# Patient Record
Sex: Male | Born: 1965 | Race: White | Hispanic: No | Smoking: Current every day smoker
Health system: Southern US, Community
[De-identification: ages and names within clinical notes are randomized; demographics above are authoritative.]

## PROBLEM LIST (undated history)

## (undated) DIAGNOSIS — F411 Generalized anxiety disorder: Secondary | ICD-10-CM

## (undated) DIAGNOSIS — F3181 Bipolar II disorder: Secondary | ICD-10-CM

## (undated) DIAGNOSIS — R351 Nocturia: Secondary | ICD-10-CM

## (undated) DIAGNOSIS — N400 Enlarged prostate without lower urinary tract symptoms: Secondary | ICD-10-CM

## (undated) DIAGNOSIS — R339 Retention of urine, unspecified: Secondary | ICD-10-CM

## (undated) DIAGNOSIS — F4010 Social phobia, unspecified: Secondary | ICD-10-CM

---

## 2007-05-16 HISTORY — PX: INGUINAL HERNIA REPAIR: SUR1180

## 2014-08-19 ENCOUNTER — Ambulatory Visit (INDEPENDENT_AMBULATORY_CARE_PROVIDER_SITE_OTHER): Payer: Managed Care, Other (non HMO) | Admitting: Psychology

## 2014-08-19 DIAGNOSIS — F411 Generalized anxiety disorder: Secondary | ICD-10-CM

## 2014-08-19 DIAGNOSIS — F322 Major depressive disorder, single episode, severe without psychotic features: Secondary | ICD-10-CM

## 2016-09-18 ENCOUNTER — Encounter (HOSPITAL_COMMUNITY): Payer: Self-pay | Admitting: Emergency Medicine

## 2016-09-18 ENCOUNTER — Emergency Department (HOSPITAL_COMMUNITY): Payer: BLUE CROSS/BLUE SHIELD

## 2016-09-18 ENCOUNTER — Emergency Department (HOSPITAL_COMMUNITY)
Admission: EM | Admit: 2016-09-18 | Discharge: 2016-09-18 | Disposition: A | Discharge status: Home / Self Care | Payer: BLUE CROSS/BLUE SHIELD | Attending: Emergency Medicine | Admitting: Emergency Medicine

## 2016-09-18 DIAGNOSIS — N5082 Scrotal pain: Secondary | ICD-10-CM

## 2016-09-18 DIAGNOSIS — N453 Epididymo-orchitis: Secondary | ICD-10-CM | POA: Diagnosis not present

## 2016-09-18 DIAGNOSIS — N50811 Right testicular pain: Secondary | ICD-10-CM | POA: Diagnosis present

## 2016-09-18 LAB — URINALYSIS, ROUTINE W REFLEX MICROSCOPIC
Bilirubin Urine: NEGATIVE
GLUCOSE, UA: NEGATIVE mg/dL
Hgb urine dipstick: NEGATIVE
KETONES UR: NEGATIVE mg/dL
Nitrite: POSITIVE — AB
PH: 6 (ref 5.0–8.0)
Protein, ur: NEGATIVE mg/dL
Specific Gravity, Urine: 1.01 (ref 1.005–1.030)

## 2016-09-18 MED ORDER — CIPROFLOXACIN HCL 500 MG PO TABS: 500.0000 mg | ORAL_TABLET | Freq: Two times a day (BID) | ORAL | 0 refills | Status: DC

## 2016-09-18 MED ORDER — FENTANYL CITRATE (PF) 100 MCG/2ML IJ SOLN
50.0000 ug | Freq: Once | INTRAMUSCULAR | Status: AC
  Administered 2016-09-18: 50 ug via INTRAVENOUS
  Filled 2016-09-18: qty 2

## 2016-09-18 MED ORDER — OXYCODONE-ACETAMINOPHEN 5-325 MG PO TABS: 2.0000 | ORAL_TABLET | Freq: Four times a day (QID) | ORAL | 0 refills | Status: DC | PRN

## 2016-09-18 MED ORDER — CIPROFLOXACIN IN D5W 400 MG/200ML IV SOLN
400.0000 mg | Freq: Once | INTRAVENOUS | Status: AC
  Administered 2016-09-18: 400 mg via INTRAVENOUS
  Filled 2016-09-18: qty 200

## 2016-09-18 MED ORDER — SODIUM CHLORIDE 0.9 % IV BOLUS (SEPSIS)
1000.0000 mL | Freq: Once | INTRAVENOUS | Status: AC
  Administered 2016-09-18: 1000 mL via INTRAVENOUS

## 2016-09-18 MED ORDER — KETOROLAC TROMETHAMINE 30 MG/ML IJ SOLN
15.0000 mg | Freq: Once | INTRAMUSCULAR | Status: AC
  Administered 2016-09-18: 15 mg via INTRAVENOUS
  Filled 2016-09-18: qty 1

## 2016-09-18 MED ORDER — HYDROMORPHONE HCL 1 MG/ML IJ SOLN
1.0000 mg | Freq: Once | INTRAMUSCULAR | Status: AC
  Administered 2016-09-18: 1 mg via INTRAVENOUS
  Filled 2016-09-18: qty 1

## 2016-09-18 MED ORDER — ONDANSETRON HCL 4 MG/2ML IJ SOLN
4.0000 mg | Freq: Once | INTRAMUSCULAR | Status: AC
  Administered 2016-09-18: 4 mg via INTRAVENOUS
  Filled 2016-09-18: qty 2

## 2016-09-18 MED ORDER — IBUPROFEN 400 MG PO TABS: 400.0000 mg | ORAL_TABLET | Freq: Three times a day (TID) | ORAL | 0 refills | Status: AC

## 2016-09-18 NOTE — Discharge Instructions (Signed)
Please be sure to follow-up with your urologist as scheduled in 2 days.  If you develop new, or concerning changes, return here

## 2016-09-18 NOTE — ED Notes (Signed)
Patient made aware of need for urine specimen, patient states she will attempt to provide specimen once pain is controlled.  Patient given urinal.

## 2016-09-18 NOTE — ED Notes (Signed)
Patient declined wheelchair

## 2016-09-18 NOTE — ED Notes (Signed)
Patient transported to Ultrasound 

## 2016-09-18 NOTE — ED Triage Notes (Signed)
Patient from home with severe right testicle pain.  Patient states he has been is following with urology for a UTI, has appointment on Wednesday but states pain was too intense this morning to wait.  Patient trembling in the bed, alert and oriented.

## 2016-09-18 NOTE — ED Provider Notes (Signed)
[facility]-EMERGENCY DEPT Provider Note   CSN: [id number] Arrival date & time: 09/18/16  [id number]     History   Chief Complaint Chief Complaint  Patient presents with  . Testicle Pain    HPI Terry LeatherwoodShannon Chaudhuri is a 51 y.o. male.  HPI  Patient presents with acute worsening of testicle pain. Pain began yesterday and since onset has become severe, incapacitating, sharp, with radiation towards the right flank. There is associated swelling in the right hemiscrotum as well as nausea, but no vomiting. No relief with home OTC medication. Patient has history of similar events, is scheduled for urology follow-up in 2 days. He has had multiple prior studies, has had alternating provisional diagnoses, according to him and family members. Patient is unclear of what these diagnoses were. Patient is not currently taking antibiotics here Since onset no other clear alleviating or exacerbating factors, and no dysuria, no hematuria.   History reviewed. No pertinent past medical history.  There are no active problems to display for this patient.   Past Surgical History:  Procedure Laterality Date  . HERNIA REPAIR         Home Medications    Prior to Admission medications   Medication Sig Start Date End Date Taking? Authorizing Provider  ALPRAZolam Prudy Feeler(XANAX) 1 MG tablet Take 1 mg by mouth 2 (two) times daily. 04/24/16  Yes [provider]  b complex vitamins tablet Take 1 tablet by mouth daily.   Yes [provider]  divalproex (DEPAKOTE ER) 500 MG 24 hr tablet Take 1,000 mg by mouth at bedtime.  04/24/16 04/24/17 Yes [provider]  silodosin (RAPAFLO) 8 MG CAPS capsule Take 8 mg by mouth daily with breakfast.   Yes [provider]    Family History No family history on file.  Social History Social History  Substance Use Topics  . Smoking status: Not on file  . Smokeless tobacco: Not on file  . Alcohol use Not on file     Allergies   Patient has no  known allergies.   Review of Systems Review of Systems  Constitutional:       Per HPI, otherwise negative  HENT:       Per HPI, otherwise negative  Respiratory:       Per HPI, otherwise negative  Cardiovascular:       Per HPI, otherwise negative  Gastrointestinal: Positive for nausea. Negative for vomiting.  Endocrine:       Negative aside from HPI  Genitourinary:       Neg aside from HPI   Musculoskeletal:       Per HPI, otherwise negative  Skin: Negative.   Allergic/Immunologic: Negative for immunocompromised state.  Neurological: Negative for syncope.     Physical Exam Updated Vital Signs BP 108/60   Pulse (!) 117   Temp 99.7 F (37.6 C) (Oral)   Resp (!) 23   SpO2 95%   Physical Exam  Constitutional: He is oriented to person, place, and time. He appears well-developed.  Uncomfortable appearing thin M  HENT:  Head: Normocephalic and atraumatic.  Eyes: Conjunctivae and EOM are normal.  Cardiovascular: Normal rate and regular rhythm.   Pulmonary/Chest: Effort normal. No stridor. No respiratory distress.  Abdominal: He exhibits no distension. There is tenderness in the suprapubic area.  Genitourinary:     Musculoskeletal: He exhibits no edema.  Neurological: He is alert and oriented to person, place, and time.  Skin: Skin is warm. He is diaphoretic.  Psychiatric: He has  a normal mood and affect.  Nursing note and vitals reviewed.    ED Treatments / Results  Labs (all labs ordered are listed, but only abnormal results are displayed) Labs Reviewed  URINE CULTURE  URINALYSIS, ROUTINE W REFLEX MICROSCOPIC   Radiology US Scrotum  Result Date: 09/18/2016 CLINICAL DATA:  Acute right testicular swelling. EXAM: SCROTAL ULTRASOUND DOPPLER ULTRASOUND OF THE TESTICLES TECHNIQUE: Complete ultrasound examination of the testicles, epididymis, and other scrotal structures was performed. Color and spectral Doppler ultrasound were also utilized to evaluate blood flow to  the testicles. COMPARISON:  None. FINDINGS: Right testicle Measurements: 3.9 x 3.2 x 3.1 cm. Increased flow is noted on Doppler. No mass or microlithiasis visualized. Left testicle Measurements: 3.6 x 3.5 x 2.3 cm. No mass or microlithiasis visualized. Right epididymis:  Appears enlarged with increased flow on Doppler. Left epididymis:  Normal in size and appearance. Hydrocele:  Mild left hydrocele is noted. Varicocele: Bilateral varicoceles are noted, with left greater than right. Pulsed Doppler interrogation of both testes demonstrates normal low resistance arterial and venous waveforms bilaterally. IMPRESSION: Increased flow is noted on Doppler in the right testicle and epididymis consistent with right-sided epididymo-orchitis. There is no evidence of testicular torsion. Small left hydrocele is noted. Bilateral varicoceles are noted. Electronically Signed   By: Lupita Raider, M.D.   On: 09/18/2016 08:37   [place] Art/ven Flow Abd Pelv Doppler  Result Date: 09/18/2016 CLINICAL DATA:  Acute right testicular swelling. EXAM: SCROTAL ULTRASOUND DOPPLER ULTRASOUND OF THE TESTICLES TECHNIQUE: Complete ultrasound examination of the testicles, epididymis, and other scrotal structures was performed. Color and spectral Doppler ultrasound were also utilized to evaluate blood flow to the testicles. COMPARISON:  None. FINDINGS: Right testicle Measurements: 3.9 x 3.2 x 3.1 cm. Increased flow is noted on Doppler. No mass or microlithiasis visualized. Left testicle Measurements: 3.6 x 3.5 x 2.3 cm. No mass or microlithiasis visualized. Right epididymis:  Appears enlarged with increased flow on Doppler. Left epididymis:  Normal in size and appearance. Hydrocele:  Mild left hydrocele is noted. Varicocele: Bilateral varicoceles are noted, with left greater than right. Pulsed Doppler interrogation of both testes demonstrates normal low resistance arterial and venous waveforms bilaterally. IMPRESSION: Increased flow is noted on  Doppler in the right testicle and epididymis consistent with right-sided epididymo-orchitis. There is no evidence of testicular torsion. Small left hydrocele is noted. Bilateral varicoceles are noted. Electronically Signed   By: Lupita Raider, M.D.   On: 09/18/2016 08:37    Procedures Procedures (including critical care time)  Medications Ordered in ED Medications  ciprofloxacin (CIPRO) IVPB 400 mg (400 mg Intravenous New Bag/Given 09/18/16 0919)  sodium chloride 0.9 % bolus 1,000 mL (1,000 mLs Intravenous New Bag/Given 09/18/16 0752)  fentaNYL (SUBLIMAZE) injection 50 mcg (50 mcg Intravenous Given 09/18/16 0753)  ondansetron (ZOFRAN) injection 4 mg (4 mg Intravenous Given 09/18/16 0752)  HYDROmorphone (DILAUDID) injection 1 mg (1 mg Intravenous Given 09/18/16 0844)  ketorolac (TORADOL) 30 MG/ML injection 15 mg (15 mg Intravenous Given 09/18/16 0844)     Initial Impression / Assessment and Plan / ED Course  I have reviewed the triage vital signs and the nursing notes.  Pertinent labs & imaging results that were available during my care of the patient were reviewed by me and considered in my medical decision making (see chart for details).  9:56 AM Patient had substantial improvement after fentanyl, Toradol, Dilaudid.  Update: Patient aware of all findings, including evidence for epididymal orchiectomy this, no evidence for  torsion, no evidence for mass. Given the patient's improvement, absence of ongoing fever, and urology follow-up within 48 hours, after ultrasound didn't demonstrate evidence for epididymal orchitis, and he received initial antibiotics, he was discharged in stable condition with close outpatient follow-up.   Final Clinical Impressions(s) / ED Diagnoses   Final diagnoses:  Scrotal pain  Epididymo-orchitis    Gerhard Munch, MD 09/18/16 [phone]

## 2016-09-20 LAB — URINE CULTURE: Culture: >=100000 — AB

## 2016-09-21 ENCOUNTER — Telehealth: Payer: Self-pay | Admitting: *Deleted

## 2016-09-21 NOTE — Telephone Encounter (Signed)
Post ED Visit - Positive Culture Follow-up  Culture report reviewed by antimicrobial stewardship pharmacist:  []  Enzo BiNathan Batchelder, Pharm.D. []  Celedonio MiyamotoJeremy Frens, [address] BCPS AQ-ID []  Garvin FilaMike Maccia, Pharm.D., BCPS []  Georgina PillionElizabeth Martin, [address]Ph[address] BCPS []  MarshfieldMinh Pham, [address]Ph[address]D., BCPS, AAHIVP [x]  Estella HuskMichelle Turner, Pharm.D., BCPS, AAHIVP []  Lysle Pearlachel Rumbarger, PharmD, BCPS []  Casilda Carlsaylor Stone, PharmD, BCPS []  Pollyann SamplesAndy Johnston, PharmD, BCPS  Positive urine culture Treated with Ciprofloxacin HCL, organism sensitive to the same and no further patient follow-up is required at this time.  Virl AxeRobertson, Nillie Bartolotta [facility] 09/21/2016, 10:13 AM

## 2016-12-01 ENCOUNTER — Other Ambulatory Visit: Payer: Self-pay | Admitting: Urology

## 2016-12-27 ENCOUNTER — Encounter (HOSPITAL_BASED_OUTPATIENT_CLINIC_OR_DEPARTMENT_OTHER): Payer: Self-pay | Admitting: *Deleted

## 2016-12-29 ENCOUNTER — Encounter (HOSPITAL_BASED_OUTPATIENT_CLINIC_OR_DEPARTMENT_OTHER): Payer: Self-pay | Admitting: *Deleted

## 2017-01-03 ENCOUNTER — Encounter (HOSPITAL_BASED_OUTPATIENT_CLINIC_OR_DEPARTMENT_OTHER): Payer: Self-pay | Admitting: *Deleted

## 2017-01-03 NOTE — Progress Notes (Signed)
NPO AFTER MN W/ EXCEPTION CLEAR LIQUIDS UNTIL 0700 (NO CREAM /MILK PRODUCTS).  ARRIVE AT 1130.  NEEDS HG. WILL TAKE AM MEDS  W/ SIPS OF WATER DOS.

## 2017-01-08 ENCOUNTER — Encounter (HOSPITAL_BASED_OUTPATIENT_CLINIC_OR_DEPARTMENT_OTHER)
Admission: RE | Disposition: A | Discharge status: Home / Self Care | Payer: Self-pay | Source: Ambulatory Visit | Attending: Urology

## 2017-01-08 ENCOUNTER — Ambulatory Visit (HOSPITAL_BASED_OUTPATIENT_CLINIC_OR_DEPARTMENT_OTHER)
Admission: RE | Admit: 2017-01-08 | Discharge: 2017-01-08 | Disposition: A | Discharge status: Home / Self Care | Payer: BLUE CROSS/BLUE SHIELD | Source: Ambulatory Visit | Attending: Urology | Admitting: Urology

## 2017-01-08 ENCOUNTER — Ambulatory Visit (HOSPITAL_BASED_OUTPATIENT_CLINIC_OR_DEPARTMENT_OTHER): Payer: BLUE CROSS/BLUE SHIELD | Admitting: Certified Registered"

## 2017-01-08 ENCOUNTER — Encounter (HOSPITAL_BASED_OUTPATIENT_CLINIC_OR_DEPARTMENT_OTHER): Payer: Self-pay

## 2017-01-08 DIAGNOSIS — N401 Enlarged prostate with lower urinary tract symptoms: Secondary | ICD-10-CM | POA: Diagnosis present

## 2017-01-08 DIAGNOSIS — F3181 Bipolar II disorder: Secondary | ICD-10-CM | POA: Insufficient documentation

## 2017-01-08 DIAGNOSIS — R351 Nocturia: Secondary | ICD-10-CM | POA: Insufficient documentation

## 2017-01-08 DIAGNOSIS — R3915 Urgency of urination: Secondary | ICD-10-CM | POA: Diagnosis not present

## 2017-01-08 DIAGNOSIS — Z79899 Other long term (current) drug therapy: Secondary | ICD-10-CM | POA: Insufficient documentation

## 2017-01-08 DIAGNOSIS — F17210 Nicotine dependence, cigarettes, uncomplicated: Secondary | ICD-10-CM | POA: Diagnosis not present

## 2017-01-08 DIAGNOSIS — F411 Generalized anxiety disorder: Secondary | ICD-10-CM | POA: Insufficient documentation

## 2017-01-08 HISTORY — PX: CYSTOSCOPY WITH INSERTION OF UROLIFT: SHX6678

## 2017-01-08 HISTORY — DX: Bipolar II disorder: F31.81

## 2017-01-08 HISTORY — DX: Benign prostatic hyperplasia without lower urinary tract symptoms: N40.0

## 2017-01-08 HISTORY — DX: Retention of urine, unspecified: R33.9

## 2017-01-08 HISTORY — DX: Social phobia, unspecified: F40.10

## 2017-01-08 HISTORY — DX: Nocturia: R35.1

## 2017-01-08 HISTORY — DX: Generalized anxiety disorder: F41.1

## 2017-01-08 LAB — POCT HEMOGLOBIN-HEMACUE: Hemoglobin: 15.5 g/dL (ref 13.0–17.0)

## 2017-01-08 SURGERY — CYSTOSCOPY WITH INSERTION OF UROLIFT
Anesthesia: General

## 2017-01-08 MED ORDER — EPHEDRINE SULFATE-NACL 50-0.9 MG/10ML-% IV SOSY
PREFILLED_SYRINGE | INTRAVENOUS | Status: DC | PRN
  Administered 2017-01-08 (×3): 10 mg via INTRAVENOUS

## 2017-01-08 MED ORDER — EPHEDRINE 5 MG/ML INJ
INTRAVENOUS | Status: AC
  Filled 2017-01-08: qty 10

## 2017-01-08 MED ORDER — ONDANSETRON HCL 4 MG/2ML IJ SOLN
INTRAMUSCULAR | Status: AC
  Filled 2017-01-08: qty 2

## 2017-01-08 MED ORDER — DEXAMETHASONE SODIUM PHOSPHATE 4 MG/ML IJ SOLN
INTRAMUSCULAR | Status: DC | PRN
  Administered 2017-01-08: 10 mg via INTRAVENOUS

## 2017-01-08 MED ORDER — MIDAZOLAM HCL 2 MG/2ML IJ SOLN
INTRAMUSCULAR | Status: AC
  Filled 2017-01-08: qty 2

## 2017-01-08 MED ORDER — FENTANYL CITRATE (PF) 100 MCG/2ML IJ SOLN
INTRAMUSCULAR | Status: DC | PRN
  Administered 2017-01-08: 50 ug via INTRAVENOUS

## 2017-01-08 MED ORDER — LACTATED RINGERS IV SOLN
INTRAVENOUS | Status: DC
  Administered 2017-01-08: 12:00:00 via INTRAVENOUS
  Filled 2017-01-08: qty 1000

## 2017-01-08 MED ORDER — PROPOFOL 10 MG/ML IV BOLUS
INTRAVENOUS | Status: AC
  Filled 2017-01-08: qty 20

## 2017-01-08 MED ORDER — LIDOCAINE 2% (20 MG/ML) 5 ML SYRINGE
INTRAMUSCULAR | Status: AC
  Filled 2017-01-08: qty 5

## 2017-01-08 MED ORDER — DEXAMETHASONE SODIUM PHOSPHATE 10 MG/ML IJ SOLN
INTRAMUSCULAR | Status: AC
  Filled 2017-01-08: qty 1

## 2017-01-08 MED ORDER — CEFAZOLIN SODIUM-DEXTROSE 2-4 GM/100ML-% IV SOLN
2.0000 g | INTRAVENOUS | Status: AC
  Administered 2017-01-08: 2 g via INTRAVENOUS
  Filled 2017-01-08: qty 100

## 2017-01-08 MED ORDER — PROPOFOL 10 MG/ML IV BOLUS
INTRAVENOUS | Status: DC | PRN
  Administered 2017-01-08: 200 mg via INTRAVENOUS

## 2017-01-08 MED ORDER — PROMETHAZINE HCL 25 MG/ML IJ SOLN
6.2500 mg | INTRAMUSCULAR | Status: DC | PRN
  Filled 2017-01-08: qty 1

## 2017-01-08 MED ORDER — FENTANYL CITRATE (PF) 100 MCG/2ML IJ SOLN
25.0000 ug | INTRAMUSCULAR | Status: DC | PRN
  Filled 2017-01-08: qty 1

## 2017-01-08 MED ORDER — TRAMADOL HCL 50 MG PO TABS: 50.0000 mg | ORAL_TABLET | Freq: Four times a day (QID) | ORAL | 0 refills | Status: AC | PRN

## 2017-01-08 MED ORDER — ONDANSETRON HCL 4 MG/2ML IJ SOLN
INTRAMUSCULAR | Status: DC | PRN
  Administered 2017-01-08: 4 mg via INTRAVENOUS

## 2017-01-08 MED ORDER — MIDAZOLAM HCL 5 MG/5ML IJ SOLN
INTRAMUSCULAR | Status: DC | PRN
  Administered 2017-01-08: 2 mg via INTRAVENOUS

## 2017-01-08 MED ORDER — CEFAZOLIN SODIUM-DEXTROSE 2-4 GM/100ML-% IV SOLN
INTRAVENOUS | Status: AC
  Filled 2017-01-08: qty 100

## 2017-01-08 MED ORDER — FENTANYL CITRATE (PF) 100 MCG/2ML IJ SOLN
INTRAMUSCULAR | Status: AC
  Filled 2017-01-08: qty 2

## 2017-01-08 MED ORDER — LIDOCAINE 2% (20 MG/ML) 5 ML SYRINGE
INTRAMUSCULAR | Status: DC | PRN
  Administered 2017-01-08: 60 mg via INTRAVENOUS

## 2017-01-08 SURGICAL SUPPLY — 15 items
BAG DRAIN URO-CYSTO SKYTR STRL (DRAIN) ×3 IMPLANT
BAG URINE DRAINAGE (UROLOGICAL SUPPLIES) ×3 IMPLANT
CATH FOLEY 2WAY SLVR  5CC 16FR (CATHETERS) ×4
CATH FOLEY 2WAY SLVR 5CC 16FR (CATHETERS) ×2 IMPLANT
CLOTH BEACON ORANGE TIMEOUT ST (SAFETY) ×3 IMPLANT
GLOVE BIO SURGEON STRL SZ8 (GLOVE) ×3 IMPLANT
GOWN STRL REUS W/TWL LRG LVL3 (GOWN DISPOSABLE) ×3 IMPLANT
GOWN STRL REUS W/TWL XL LVL3 (GOWN DISPOSABLE) ×3 IMPLANT
HOLDER FOLEY CATH W/STRAP (MISCELLANEOUS) IMPLANT
IV NS IRRIG 3000ML ARTHROMATIC (IV SOLUTION) ×6 IMPLANT
MANIFOLD NEPTUNE II (INSTRUMENTS) ×3 IMPLANT
PACK CYSTO (CUSTOM PROCEDURE TRAY) ×3 IMPLANT
SYSTEM UROLIFT (Male Continence) ×12 IMPLANT
TUBE CONNECTING 12'X1/4 (SUCTIONS) ×1
TUBE CONNECTING 12X1/4 (SUCTIONS) ×2 IMPLANT

## 2017-01-08 NOTE — Anesthesia Preprocedure Evaluation (Signed)
Anesthesia Evaluation  Patient identified by MRN, date of birth, ID band Patient awake    Reviewed: Allergy & Precautions, NPO status , Patient's Chart, lab work & pertinent test results  Airway Mallampati: II  TM Distance: >3 FB Neck ROM: Full    Dental  (+) Dental Advisory Given   Pulmonary Current Smoker,    breath sounds clear to auscultation       Cardiovascular negative cardio ROS   Rhythm:Regular Rate:Normal     Neuro/Psych Bipolar Disorder negative neurological ROS     GI/Hepatic negative GI ROS, Neg liver ROS,   Endo/Other  negative endocrine ROS  Renal/GU negative Renal ROS     Musculoskeletal   Abdominal   Peds  Hematology negative hematology ROS (+)   Anesthesia Other Findings   Reproductive/Obstetrics                             Anesthesia Physical Anesthesia Plan  ASA: I  Anesthesia Plan: General   Post-op Pain Management:    Induction: Intravenous  PONV Risk Score and Plan: 2 and Ondansetron, Dexamethasone and Treatment may vary due to age or medical condition  Airway Management Planned: LMA  Additional Equipment:   Intra-op Plan:   Post-operative Plan: Extubation in OR  Informed Consent: I have reviewed the patients History and Physical, chart, labs and discussed the procedure including the risks, benefits and alternatives for the proposed anesthesia with the patient or authorized representative who has indicated his/her understanding and acceptance.   Dental advisory given  Plan Discussed with: CRNA  Anesthesia Plan Comments:         Anesthesia Quick Evaluation

## 2017-01-08 NOTE — Anesthesia Postprocedure Evaluation (Signed)
Anesthesia Post Note  Patient: Jet Wagley  Procedure(s) Performed: Procedure(s) (LRB): CYSTOSCOPY WITH INSERTION OF UROLIFT (N/A)     Patient location during evaluation: PACU Anesthesia Type: General Level of consciousness: awake and alert Pain management: pain level controlled Vital Signs Assessment: post-procedure vital signs reviewed and stable Respiratory status: spontaneous breathing, nonlabored ventilation, respiratory function stable and patient connected to nasal cannula oxygen Cardiovascular status: blood pressure returned to baseline and stable Postop Assessment: no signs of nausea or vomiting Anesthetic complications: no    Last Vitals:  Vitals:   01/08/17 1400 01/08/17 1401  BP:  105/69  Pulse: 94 95  Resp: 19 18  Temp:    SpO2: 95% 95%    Last Pain:  Vitals:   01/08/17 1134  TempSrc: Oral                 Herta Hink,JAMES TERRILL

## 2017-01-08 NOTE — Discharge Instructions (Signed)
Indwelling Urinary Catheter Care, Adult °Take good care of your catheter to keep it working and to prevent problems. °How to wear your catheter °Attach your catheter to your leg with tape (adhesive tape) or a leg strap. Make sure it is not too tight. If you use tape, remove any bits of tape that are already on the catheter. °How to wear a drainage bag °You should have: °· A large overnight bag. °· A small leg bag. ° °Overnight Bag °You may wear the overnight bag at any time. Always keep the bag below the level of your bladder but off the floor. When you sleep, put a clean plastic bag in a wastebasket. Then hang the bag inside the wastebasket. °Leg Bag °Never wear the leg bag at night. Always wear the leg bag below your knee. Keep the leg bag secure with a leg strap or tape. °How to care for your skin °· Clean the skin around the catheter at least once every day. °· Shower every day. Do not take baths. °· Put creams, lotions, or ointments on your genital area only as told by your doctor. °· Do not use powders, sprays, or lotions on your genital area. °How to clean your catheter and your skin °1. Wash your hands with soap and water. °2. Wet a washcloth in warm water and gentle (mild) soap. °3. Use the washcloth to clean the skin where the catheter enters your body. Clean downward and wipe away from the catheter in small circles. Do not wipe toward the catheter. °4. Pat the area dry with a clean towel. Make sure to clean off all soap. °How to care for your drainage bags °Empty your drainage bag when it is ?-½ full or at least 2-3 times a day. Replace your drainage bag once a month or sooner if it starts to smell bad or look dirty. Do not clean your drainage bag unless told by your doctor. °Emptying a drainage bag ° °Supplies Needed °· Rubbing alcohol. °· Gauze pad or cotton ball. °· Tape or a leg strap. ° °Steps °1. Wash your hands with soap and water. °2. Separate (detach) the bag from your leg. °3. Hold the bag over  the toilet or a clean container. Keep the bag below your hips and bladder. This stops pee (urine) from going back into the tube. °4. Open the pour spout at the bottom of the bag. °5. Empty the pee into the toilet or container. Do not let the pour spout touch any surface. °6. Put rubbing alcohol on a gauze pad or cotton ball. °7. Use the gauze pad or cotton ball to clean the pour spout. °8. Close the pour spout. °9. Attach the bag to your leg with tape or a leg strap. °10. Wash your hands. ° °Changing a drainage bag °Supplies Needed °· Alcohol wipes. °· A clean drainage bag. °· Adhesive tape or a leg strap. ° °Steps °1. Wash your hands with soap and water. °2. Separate the dirty bag from your leg. °3. Pinch the rubber catheter with your fingers so that pee does not spill out. °4. Separate the catheter tube from the drainage tube where these tubes connect (at the connection valve). Do not let the tubes touch any surface. °5. Clean the end of the catheter tube with an alcohol wipe. Use a different alcohol wipe to clean the end of the drainage tube. °6. Connect the catheter tube to the drainage tube of the clean bag. °7. Attach the new bag to   the leg with adhesive tape or a leg strap. °8. Wash your hands. ° °How to prevent infection and other problems °· Never pull on your catheter or try to remove it. Pulling can damage tissue in your body. °· Always wash your hands before and after touching your catheter. °· If a leg strap gets wet, replace it with a dry one. °· Drink enough fluids to keep your pee clear or pale yellow, or as told by your doctor. °· Do not let the drainage bag or tubing touch the floor. °· Wear cotton underwear. °· If you are male, wipe from front to back after you poop (have a bowel movement). °· Check on the catheter often to make sure it works and the tubing is not twisted. °Get help if: °· Your pee is cloudy. °· Your pee smells unusually bad. °· Your pee is not draining into the bag. °· Your  tube gets clogged. °· Your catheter starts to leak. °· Your bladder feels full. °Get help right away if: °· You have redness, swelling, or pain where the catheter enters your body. °· You have fluid, pus, or a bad smell coming from the area where the catheter enters your body. °· The area where the catheter enters your body feels warm. °· You have a fever. °· You have pain in your: °? Stomach (abdomen). °? Legs. °? Lower back. °? Bladder. °· You see blood fill the catheter. °· Your pee is pink or red. °· You feel sick to your stomach (nauseous). °· You throw up (vomit). °· You have chills. °· Your catheter gets pulled out. °This information is not intended to replace advice given to you by your health care provider. Make sure you discuss any questions you have with your health care provider. °Document Released: 08/26/2012 Document Revised: 03/29/2016 Document Reviewed: 10/14/2013 °Elsevier Interactive Patient Education © 2018 [employer] ° ° ° ° °Post Anesthesia Home Care Instructions ° °Activity: °Get plenty of rest for the remainder of the day. A responsible individual must stay with you for 24 hours following the procedure.  °For the next 24 hours, DO NOT: °-Drive a car °-Operate machinery °-Drink alcoholic beverages °-Take any medication unless instructed by your physician °-Make any legal decisions or sign important papers. ° °Meals: °Start with liquid foods such as gelatin or soup. Progress to regular foods as tolerated. Avoid greasy, spicy, heavy foods. If nausea and/or vomiting occur, drink only clear liquids until the nausea and/or vomiting subsides. Call your physician if vomiting continues. ° °Special Instructions/Symptoms: °Your throat may feel dry or sore from the anesthesia or the breathing tube placed in your throat during surgery. If this causes discomfort, gargle with warm salt water. The discomfort should disappear within 24 hours. ° °If you had a scopolamine patch placed behind your ear for the  management of post- operative nausea and/or vomiting: ° °1. The medication in the patch is effective for 72 hours, after which it should be removed.  Wrap patch in a tissue and discard in the trash. Wash hands thoroughly with soap and water. °2. You may remove the patch earlier than 72 hours if you experience unpleasant side effects which may include dry mouth, dizziness or visual disturbances. °3. Avoid touching the patch. Wash your hands with soap and water after contact with the patch. °  ° °

## 2017-01-08 NOTE — Op Note (Signed)
   PREOPERATIVE DIAGNOSIS: Benign prostatic hypertrophy with urinary urgency  POSTOPERATIVE DIAGNOSIS: Benign prostatic hypertrophy with urinary urgency  PROCEDURE: Cystoscopy with implantation of UroLift devices, 4 implants.  SURGEON: Wilkie Aye, M.D.  ANESTHESIA: General  ANTIBIOTICS: ancef  SPECIMEN: None.  DRAINS: A 16-French Foley catheter.  BLOOD LOSS: Minimal.  COMPLICATIONS: None.  INDICATIONS:The Patient is an 51 year old white male with BPH and urinary urgency. He has failed medical therapy and has elected UroLift for definitive treatment.  FINDINGS OF PROCEDURE: He was taken to the operating room where a genral anesthetic was induced. He was placed in lithotomy position and was fitted with PAS hose. His perineum and genitalia were prepped with chlorhexidine, and he was draped in usual sterile fashion.  Cystoscopy was performed using the UroLift scope and 0 degree lens. Examination revealed a normal urethra. The external sphincter was intact. Prostatic urethra was approximately 4 cm in length with lateral lobe enlargement. There was also little bit of bladder neck elevation. Inspection of bladder revealed mild-to-moderate trabeculation with no tumors, stones, or inflammation. No cellules or diverticula were noted. Ureteral orifices were in their normal anatomic position effluxing clear urine.  After initial cystoscopy, the visual obturator was replaced with the first UroLift device. This was turned to the 9 o'clock position and pulled back to the veru and then slightly advanced. Pressure was then applied to the right lateral lobe and the UroLift device was deployed.  The second UroLift device was then inserted and applied to the left lateral lobe at 3 o'clock and deployed in the mid prostatic urethra. After this, there was still some apparent obstruction closer to the bladder neck. So a second level of UroLift your left device was  applied between the mid urethra and the proximal urethra providing further patency to the prostatic urethra. At this point, there was mild bleeding but the patient did have a spinal anesthetic. So it was thought that a Foley catheter was indicated. The scope was removed and a 16-French Foley catheter was inserted without difficulty. The balloon was filled with 10 mL sterile fluid, and the catheter was placed to straight drainage.  COMPLICATIONS: None   CONDITION: Stable, extubated, transferred to PACU  PLAN: The patient will be discharged home and followup in 2 days for a voiding trial.

## 2017-01-08 NOTE — H&P (Signed)
Urology Admission H&P  Chief Complaint: urinary urgency  History of Present Illness: [name] is a 51yo with a hx of BPH with urinary urgency refractory to medical therapy  Past Medical History:  Diagnosis Date  . Bipolar 2 disorder (HCC)   . BPH (benign prostatic hyperplasia)   . GAD (generalized anxiety disorder)   . Incomplete emptying of bladder   . Nocturia   . Social anxiety disorder    Past Surgical History:  Procedure Laterality Date  . INGUINAL HERNIA REPAIR Bilateral 2009    Home Medications:  Prescriptions Prior to Admission  Medication Sig Dispense Refill Last Dose  . ALPRAZolam (XANAX) 1 MG tablet Take 1 mg by mouth 2 (two) times daily.    01/08/2017 at 0700  . b complex vitamins tablet Take 1 tablet by mouth daily.   Past Week at Unknown time  . divalproex (DEPAKOTE ER) 500 MG 24 hr tablet Take 1,000 mg by mouth 2 (two) times daily.    01/08/2017 at 0700  . silodosin (RAPAFLO) 8 MG CAPS capsule Take 8 mg by mouth daily with breakfast.    01/08/2017 at 0700  . sulfamethoxazole-trimethoprim (BACTRIM DS,SEPTRA DS) 800-160 MG tablet Take 1 tablet by mouth 2 (two) times daily.   01/08/2017 at 0700   Allergies: No Known Allergies  History reviewed. No pertinent family history. Social History:  reports that he has been smoking Cigarettes.  He has a 25.00 pack-year smoking history. He has never used smokeless tobacco. He reports that he drinks alcohol. His drug history is not on file.  Review of Systems  Genitourinary: Positive for frequency and urgency.  All other systems reviewed and are negative.   Physical Exam:  Vital signs in last 24 hours: Temp:  [98.4 F (36.9 C)] 98.4 F (36.9 C) (08/27 1134) Pulse Rate:  [84] 84 (08/27 1134) Resp:  [16] 16 (08/27 1134) BP: (105)/(60) 105/60 (08/27 1134) SpO2:  [99 %] 99 % (08/27 1134) Weight:  [74.8 kg (165 lb)] 74.8 kg (165 lb) (08/27 1134) Physical Exam  Constitutional: He is oriented to person, place, and time. He  appears well-developed and well-nourished.  HENT:  Head: Normocephalic and atraumatic.  Eyes: Pupils are equal, round, and reactive to light. EOM are normal.  Neck: Normal range of motion. No thyromegaly present.  Cardiovascular: Normal rate and regular rhythm.   Respiratory: Effort normal. No respiratory distress.  GI: Soft. He exhibits no distension.  Musculoskeletal: Normal range of motion. He exhibits no edema.  Neurological: He is alert and oriented to person, place, and time.  Skin: Skin is warm and dry.  Psychiatric: He has a normal mood and affect. His behavior is normal. Judgment and thought content normal.    Laboratory Data:  Results for orders placed or performed during the hospital encounter of 01/08/17 (from the past 24 hour(s))  Hemoglobin-hemacue, POC     Status: None   Collection Time: 01/08/17 12:23 PM  Result Value Ref Range   Hemoglobin 15.5 13.0 - 17.0 g/dL   No results found for this or any previous visit (from the past 240 hour(s)). Creatinine: No results for input(s): CREATININE in the last 168 hours. Baseline Creatinine: unknwon  Impression/Assessment:  50yo with BPH with urgenyc refractory to medical therayp  Plan:  The risks/benefits/alternatives to Urolift were explained to the patient and he understands and wishes to proceed with surgery  Terry Lloyd 01/08/2017, 12:43 PM

## 2017-01-08 NOTE — Transfer of Care (Signed)
Immediate Anesthesia Transfer of Care Note  Patient: Terry Lloyd  Procedure(s) Performed: Procedure(s) (LRB): CYSTOSCOPY WITH INSERTION OF UROLIFT (N/A)  Patient Location: PACU  Anesthesia Type: General  Level of Consciousness: awake, oriented, sedated and patient cooperative  Airway & Oxygen Therapy: Patient Spontanous Breathing and Patient connected to face mask oxygen  Post-op Assessment: Report given to PACU RN and Post -op Vital signs reviewed and stable  Post vital signs: Reviewed and stable  Complications: No apparent anesthesia complications Last Vitals:  Vitals:   01/08/17 1134  BP: 105/60  Pulse: 84  Resp: 16  Temp: 36.9 C  SpO2: 99%    Last Pain:  Vitals:   01/08/17 1134  TempSrc: Oral      Patients Stated Pain Goal: 5 (01/08/17 1214)

## 2017-01-08 NOTE — Anesthesia Procedure Notes (Signed)
Procedure Name: LMA Insertion Date/Time: 01/08/2017 12:55 PM Performed by: Renella Cunas D Pre-anesthesia Checklist: Patient identified, Emergency Drugs available, Suction available and Patient being monitored Patient Re-evaluated:Patient Re-evaluated prior to induction Oxygen Delivery Method: Circle system utilized Preoxygenation: Pre-oxygenation with 100% oxygen Induction Type: IV induction Ventilation: Mask ventilation without difficulty LMA: LMA inserted LMA Size: 4.0 Number of attempts: 1 Airway Equipment and Method: Bite block Placement Confirmation: positive ETCO2 Tube secured with: Tape Dental Injury: Teeth and Oropharynx as per pre-operative assessment

## 2017-01-09 ENCOUNTER — Encounter (HOSPITAL_BASED_OUTPATIENT_CLINIC_OR_DEPARTMENT_OTHER): Payer: Self-pay | Admitting: Urology

## 2018-01-04 ENCOUNTER — Emergency Department (HOSPITAL_COMMUNITY): Payer: BLUE CROSS/BLUE SHIELD

## 2018-01-04 ENCOUNTER — Encounter (HOSPITAL_COMMUNITY): Payer: Self-pay | Admitting: *Deleted

## 2018-01-04 ENCOUNTER — Emergency Department (HOSPITAL_COMMUNITY)
Admission: EM | Admit: 2018-01-04 | Discharge: 2018-01-04 | Disposition: A | Discharge status: Home / Self Care | Payer: BLUE CROSS/BLUE SHIELD | Attending: Emergency Medicine | Admitting: Emergency Medicine

## 2018-01-04 ENCOUNTER — Other Ambulatory Visit: Payer: Self-pay

## 2018-01-04 DIAGNOSIS — F17210 Nicotine dependence, cigarettes, uncomplicated: Secondary | ICD-10-CM | POA: Diagnosis not present

## 2018-01-04 DIAGNOSIS — N390 Urinary tract infection, site not specified: Secondary | ICD-10-CM | POA: Insufficient documentation

## 2018-01-04 DIAGNOSIS — Z79899 Other long term (current) drug therapy: Secondary | ICD-10-CM | POA: Insufficient documentation

## 2018-01-04 DIAGNOSIS — R300 Dysuria: Secondary | ICD-10-CM | POA: Diagnosis present

## 2018-01-04 LAB — URINALYSIS, ROUTINE W REFLEX MICROSCOPIC
Bilirubin Urine: NEGATIVE
Glucose, UA: NEGATIVE mg/dL
Hgb urine dipstick: NEGATIVE
Ketones, ur: NEGATIVE mg/dL
Nitrite: NEGATIVE
Protein, ur: 30 mg/dL — AB
Specific Gravity, Urine: 1.008 (ref 1.005–1.030)
pH: 7 (ref 5.0–8.0)

## 2018-01-04 LAB — CBC WITH DIFFERENTIAL/PLATELET
ABS IMMATURE GRANULOCYTES: 0 10*3/uL (ref 0.0–0.1)
BASOS ABS: 0.1 10*3/uL (ref 0.0–0.1)
Basophils Relative: 1 %
Eosinophils Absolute: 0.2 10*3/uL (ref 0.0–0.7)
Eosinophils Relative: 1 %
HCT: 41.7 % (ref 39.0–52.0)
HEMOGLOBIN: 13.5 g/dL (ref 13.0–17.0)
Immature Granulocytes: 0 %
LYMPHS PCT: 29 %
Lymphs Abs: 4.1 10*3/uL — ABNORMAL HIGH (ref 0.7–4.0)
MCH: 31.1 pg (ref 26.0–34.0)
MCHC: 32.4 g/dL (ref 30.0–36.0)
MCV: 96.1 fL (ref 78.0–100.0)
Monocytes Absolute: 1.3 10*3/uL — ABNORMAL HIGH (ref 0.1–1.0)
Monocytes Relative: 9 %
NEUTROS ABS: 8.5 10*3/uL — AB (ref 1.7–7.7)
Neutrophils Relative %: 60 %
Platelets: 341 10*3/uL (ref 150–400)
RBC: 4.34 MIL/uL (ref 4.22–5.81)
RDW: 13.8 % (ref 11.5–15.5)
WBC: 14.2 10*3/uL — AB (ref 4.0–10.5)

## 2018-01-04 LAB — BASIC METABOLIC PANEL
Anion gap: 8 (ref 5–15)
BUN: 11 mg/dL (ref 6–20)
CHLORIDE: 106 mmol/L (ref 98–111)
CO2: 23 mmol/L (ref 22–32)
CREATININE: 0.76 mg/dL (ref 0.61–1.24)
Calcium: 9.2 mg/dL (ref 8.9–10.3)
Glucose, Bld: 99 mg/dL (ref 70–99)
POTASSIUM: 4.2 mmol/L (ref 3.5–5.1)
Sodium: 137 mmol/L (ref 135–145)

## 2018-01-04 MED ORDER — CEPHALEXIN 500 MG PO CAPS: 500.0000 mg | ORAL_CAPSULE | Freq: Three times a day (TID) | ORAL | 0 refills | Status: AC

## 2018-01-04 MED ORDER — IOPAMIDOL (ISOVUE-300) INJECTION 61%
100.0000 mL | Freq: Once | INTRAVENOUS | Status: AC | PRN
  Administered 2018-01-04: 100 mL via INTRAVENOUS

## 2018-01-04 MED ORDER — IOPAMIDOL (ISOVUE-300) INJECTION 61%
INTRAVENOUS | Status: AC
  Filled 2018-01-04: qty 100

## 2018-01-04 MED ORDER — SODIUM CHLORIDE 0.9 % IV SOLN
1.0000 g | Freq: Once | INTRAVENOUS | Status: AC
  Administered 2018-01-04: 1 g via INTRAVENOUS
  Filled 2018-01-04: qty 10

## 2018-01-04 NOTE — ED Provider Notes (Signed)
[facility] EMERGENCY DEPARTMENT Provider Note  CSN: [id number] Arrival date & time: 01/04/18 0013  Chief Complaint(s) Recurrent UTI and Testicle Pain  HPI Terry Lloyd is a 52 y.o. male with a history of BPH status post UroLift 1 year ago who presents to the emergency department with several days mild dysuria.  Today he noted purulent penile drainage and testicular pain similar to prior urinary tract infections.  He denies any prior history of STD.  No prior history of prostatitis.  Denies any abdominal pain.  No fevers or chills.  No nausea vomiting.  No diarrhea.  Patient endorses same-sex anal intercourse without protection.  On review of records patient has had prior urinary tract infections which grew out E. Coli sensitive to cephalosporins.   HPI  Past Medical History Past Medical History:  Diagnosis Date  . Bipolar 2 disorder (HCC)   . BPH (benign prostatic hyperplasia)   . GAD (generalized anxiety disorder)   . Incomplete emptying of bladder   . Nocturia   . Social anxiety disorder    There are no active problems to display for this patient.  Home Medication(s) Prior to Admission medications   Medication Sig Start Date End Date Taking? Authorizing Provider  ALPRAZolam Prudy Feeler) 1 MG tablet Take 1 mg by mouth 2 (two) times daily.  04/24/16  Yes [provider]  divalproex (DEPAKOTE ER) 500 MG 24 hr tablet Take 1,000 mg by mouth 2 (two) times daily.  04/24/16 01/04/26 Yes [provider]  cephALEXin (KEFLEX) 500 MG capsule Take 1 capsule (500 mg total) by mouth 3 (three) times daily for 10 days. 01/04/18 01/14/18  Nira Conn, MD  traMADol (ULTRAM) 50 MG tablet Take 1 tablet (50 mg total) by mouth every 6 (six) hours as needed. Patient not taking: Reported on 01/04/2018 01/08/17 01/08/18  Malen Gauze, MD         Past Surgical History Past Surgical History:  Procedure Laterality Date  . CYSTOSCOPY WITH INSERTION OF UROLIFT N/A 01/08/2017   Procedure: CYSTOSCOPY WITH INSERTION OF UROLIFT;  Surgeon: Malen Gauze, MD;  Location: [facility];  Service: Urology;  Laterality: N/A;  . INGUINAL HERNIA REPAIR Bilateral 2009   Family History No family history on file.  Social History Social History   Tobacco Use  . Smoking status: Current Every Day Smoker    Packs/day: 1.00    Years: 25.00    Pack years: 25.00    Types: Cigarettes  . Smokeless tobacco: Never Used  Substance Use Topics  . Alcohol use: Yes    Comment: rare  . Drug use: Not on file   Allergies Patient has no known allergies.  Review of Systems Review of Systems All other systems are reviewed and are negative for acute change except as noted in the HPI  Physical Exam Vital Signs  I have reviewed the triage vital signs BP (!) 149/92   Pulse 92   Temp 98.1 F (36.7 C)   Resp 16   SpO2 100%   Physical Exam  Constitutional: He is oriented to person, place, and time. He appears well-developed and well-nourished. No distress.  HENT:  Head: Normocephalic and atraumatic.  Right Ear: External ear normal.  Left Ear: External ear normal.  Nose: Nose normal.  Mouth/Throat: Mucous membranes are normal. No trismus in the jaw.  Eyes: Conjunctivae and EOM are normal. No scleral icterus.  Neck: Normal range of motion and phonation normal.  Cardiovascular: Normal rate and  regular rhythm.  Pulmonary/Chest: Effort normal. No stridor. No respiratory distress.  Abdominal: He exhibits no distension. There is no tenderness. There is no rigidity, no rebound and no guarding. Hernia confirmed negative in the right inguinal area and confirmed negative in the left inguinal area.  Genitourinary: Testes normal. Cremasteric reflex is present. Right testis shows no mass, no swelling and no tenderness. Left testis shows no  mass, no swelling and no tenderness. Uncircumcised. Discharge (mild purulence at the urethral meatus) found.  Musculoskeletal: Normal range of motion. He exhibits no edema.  Neurological: He is alert and oriented to person, place, and time.  Skin: He is not diaphoretic.  Psychiatric: He has a normal mood and affect. His behavior is normal.  Vitals reviewed.   ED Results and Treatments Labs (all labs ordered are listed, but only abnormal results are displayed) Labs Reviewed  URINALYSIS, ROUTINE W REFLEX MICROSCOPIC - Abnormal; Notable for the following components:      Result Value   APPearance HAZY (*)    Protein, ur 30 (*)    Leukocytes, UA LARGE (*)    Bacteria, UA MANY (*)    All other components within normal limits  CBC WITH DIFFERENTIAL/PLATELET - Abnormal; Notable for the following components:   WBC 14.2 (*)    Neutro Abs 8.5 (*)    Lymphs Abs 4.1 (*)    Monocytes Absolute 1.3 (*)    All other components within normal limits  URINE CULTURE  BASIC METABOLIC PANEL  GC/CHLAMYDIA PROBE AMP ([place]) NOT [facility]                                                                                                                         EKG  EKG Interpretation  Date/Time:    Ventricular Rate:    PR Interval:    QRS Duration:   QT Interval:    QTC Calculation:   R Axis:     Text Interpretation:        Radiology Ct Abdomen Pelvis W Contrast  Result Date: 01/04/2018 CLINICAL DATA:  UTI testicular pain EXAM: CT ABDOMEN AND PELVIS WITH CONTRAST TECHNIQUE: Multidetector CT imaging of the abdomen and pelvis was performed using the standard protocol following bolus administration of intravenous contrast. CONTRAST:  100mL ISOVUE-300 IOPAMIDOL (ISOVUE-300) INJECTION 61% COMPARISON:  Ultrasound 09/18/2016 FINDINGS: Lower chest: Lung bases demonstrate no acute consolidation or effusion. Heart size within normal limits. Small hiatal hernia Hepatobiliary: Subcentimeter hypodense liver  lesions too small to further characterize. No calcified gallstone or biliary dilatation. Pancreas: Unremarkable. No pancreatic ductal dilatation or surrounding inflammatory changes. Spleen: Normal in size without focal abnormality. Adrenals/Urinary Tract: Bilateral fat density adrenal gland nodules measuring 16 mm on the right and 19 mm on the left. Kidneys are normal, without renal calculi, focal lesion, or hydronephrosis. Bladder is markedly thick walled and irregular. Stomach/Bowel: Stomach is within normal limits. Appendix appears normal. No evidence of bowel wall thickening, distention, or inflammatory changes. Vascular/Lymphatic: Mild aortic  atherosclerosis. No significantly enlarged lymph nodes Reproductive: Postsurgical changes of the prostate. Other: Negative for free air or free fluid. Musculoskeletal: No acute or significant osseous findings. IMPRESSION: 1. Marked wall thickening of the bladder, could be secondary to cystitis or chronic obstruction 2. Bilateral adrenal gland adenomas. Electronically Signed   By: Jasmine Pang M.D.   On: 01/04/2018 03:40   Pertinent labs & imaging results that were available during my care of the patient were reviewed by me and considered in my medical decision making (see chart for details).  Medications Ordered in ED Medications  cefTRIAXone (ROCEPHIN) 1 g in sodium chloride 0.9 % 100 mL IVPB (0 g Intravenous Stopped 01/04/18 0337)  iopamidol (ISOVUE-300) 61 % injection 100 mL (100 mLs Intravenous Contrast Given 01/04/18 0311)                                                                                                                                    Procedures Procedures  (including critical care time)  Medical Decision Making / ED Course I have reviewed the nursing notes for this encounter and the patient's prior records (if available in EHR or on provided paperwork).    Work-up revealed leukocytosis.  UA consistent with urinary tract infection.   Given your left and purulence, CT of the abdomen and pelvis was obtained to rule out surgical complication versus periprosthetic abscess.  CT scan was positive only for bladder wall thickening consistent with urinary tract infection.  Urine culture sent.  GC/chlamydia also sent.  Patient given empiric Rocephin.  Will discharge home with Keflex.  Recommend follow-up in 7 to 10 days for repeat urinalysis.  The patient appears reasonably screened and/or stabilized for discharge and I doubt any other medical condition or other [facility] requiring further screening, evaluation, or treatment in the ED at this time prior to discharge.  The patient is safe for discharge with strict return precautions.   Final Clinical Impression(s) / ED Diagnoses Final diagnoses:  Lower urinary tract infectious disease    Disposition: Discharge  Condition: Good  I have discussed the results, Dx and Tx plan with the patient who expressed understanding and agree(s) with the plan. Discharge instructions discussed at great length. The patient was given strict return precautions who verbalized understanding of the instructions. No further questions at time of discharge.    ED Discharge Orders         Ordered    cephALEXin (KEFLEX) 500 MG capsule  3 times daily     01/04/18 0354           Follow Up: Arlan Organ, MD [address] [place] [address] [phone]  Schedule an appointment as soon as possible for a visit  in 2 weeks for repeat urine analysis to ensure appropriate treatement.     This chart was dictated using voice recognition software.  Despite best efforts to proofread,  errors can occur  which can change the documentation meaning.   Nira Conn, MD 01/04/18 [phone]

## 2018-01-04 NOTE — ED Notes (Signed)
Patient verbalizes understanding of medications and discharge instructions. No further questions at this time. VSS and patient ambulatory at discharge.   

## 2018-01-04 NOTE — ED Triage Notes (Signed)
Pt has hx of UTI; today started having testicular pain. Has had burning with urination and has had purulent penile drainage

## 2018-01-06 LAB — URINE CULTURE: Culture: >=100000 — AB

## 2018-01-07 ENCOUNTER — Telehealth: Payer: Self-pay | Admitting: Emergency Medicine

## 2018-01-07 NOTE — Telephone Encounter (Signed)
Post ED Visit - Positive Culture Follow-up  Culture report reviewed by antimicrobial stewardship pharmacist:  []  Terry Lloyd, Pharm.D. [x]  Terry Lloyd, Pharm.D., BCPS AQ-ID []  Terry Lloyd, Pharm.D., BCPS []  Terry Lloyd, Pharm.D., BCPS []  PalisadeMinh Lloyd, [address]Pharm.D., BCPS, AAHIVP []  Terry Lloyd, Pharm.D., BCPS, AAHIVP []  Terry Lloyd, PharmD, BCPS []  Terry Lloyd, PharmD, BCPS []  Terry Lloyd, PharmD, BCPS []  Terry Lloyd, PharmD  Positive urine culture Treated with cephalexin, organism sensitive to the same and no further patient follow-up is required at this time.  Terry Lloyd, Terry Lloyd 01/07/2018, 4:04 PM

## 2019-02-04 ENCOUNTER — Encounter (HOSPITAL_COMMUNITY): Payer: Self-pay | Admitting: Emergency Medicine

## 2019-02-04 ENCOUNTER — Emergency Department (HOSPITAL_COMMUNITY)
Admission: EM | Admit: 2019-02-04 | Discharge: 2019-02-04 | Disposition: A | Discharge status: Home / Self Care | Payer: BC Managed Care – PPO | Attending: Emergency Medicine | Admitting: Emergency Medicine

## 2019-02-04 DIAGNOSIS — N390 Urinary tract infection, site not specified: Secondary | ICD-10-CM | POA: Insufficient documentation

## 2019-02-04 DIAGNOSIS — R300 Dysuria: Secondary | ICD-10-CM | POA: Diagnosis present

## 2019-02-04 DIAGNOSIS — F17210 Nicotine dependence, cigarettes, uncomplicated: Secondary | ICD-10-CM | POA: Insufficient documentation

## 2019-02-04 DIAGNOSIS — Z79899 Other long term (current) drug therapy: Secondary | ICD-10-CM | POA: Insufficient documentation

## 2019-02-04 LAB — URINALYSIS, ROUTINE W REFLEX MICROSCOPIC
Bilirubin Urine: NEGATIVE
Glucose, UA: NEGATIVE mg/dL
Ketones, ur: NEGATIVE mg/dL
Nitrite: POSITIVE — AB
Protein, ur: 30 mg/dL — AB
Specific Gravity, Urine: 1.003 — ABNORMAL LOW (ref 1.005–1.030)
WBC, UA: >50 WBC/hpf — ABNORMAL HIGH (ref 0–5)
pH: 7 (ref 5.0–8.0)

## 2019-02-04 MED ORDER — OXYCODONE HCL 5 MG PO TABS
5.0000 mg | ORAL_TABLET | Freq: Once | ORAL | Status: AC
  Administered 2019-02-04: 5 mg via ORAL
  Filled 2019-02-04: qty 1

## 2019-02-04 MED ORDER — CEPHALEXIN 500 MG PO CAPS: 500.0000 mg | ORAL_CAPSULE | Freq: Four times a day (QID) | ORAL | 0 refills | Status: DC

## 2019-02-04 MED ORDER — KETOROLAC TROMETHAMINE 60 MG/2ML IM SOLN
15.0000 mg | Freq: Once | INTRAMUSCULAR | Status: AC
  Administered 2019-02-04: 15 mg via INTRAMUSCULAR
  Filled 2019-02-04: qty 2

## 2019-02-04 MED ORDER — CEPHALEXIN 250 MG PO CAPS
1000.0000 mg | ORAL_CAPSULE | Freq: Once | ORAL | Status: AC
  Administered 2019-02-04: 21:00:00 1000 mg via ORAL
  Filled 2019-02-04: qty 4

## 2019-02-04 MED ORDER — ACETAMINOPHEN 500 MG PO TABS
1000.0000 mg | ORAL_TABLET | Freq: Once | ORAL | Status: AC
  Administered 2019-02-04: 1000 mg via ORAL
  Filled 2019-02-04: qty 2

## 2019-02-04 NOTE — ED Provider Notes (Signed)
[place] EMERGENCY DEPARTMENT Provider Note   CSN: [id number] Arrival date & time: 02/04/19  1823     History   Chief Complaint Chief Complaint  Patient presents with  . Urinary Tract Infection    HPI Terry Lloyd is a 53 y.o. male.     53 yo M with a chief complaints of dysuria penile discharge and left flank pain.  Going on for the past 48 hours or so.  No fevers.  Has a history of recurrent urinary tract infections after having a bladder lift procedure.  States it feels like the same.  No overt abdominal pain no vomiting.  The history is provided by the patient.  Urinary Tract Infection Presenting symptoms: dysuria and penile discharge   Context: during intercourse   Relieved by:  Nothing Worsened by:  Nothing Ineffective treatments:  None tried Associated symptoms: flank pain   Associated symptoms: no abdominal pain, no diarrhea, no fever, no genital rash and no vomiting   Risk factors: bladder surgery     Past Medical History:  Diagnosis Date  . Bipolar 2 disorder ([place])   . BPH (benign prostatic hyperplasia)   . GAD (generalized anxiety disorder)   . Incomplete emptying of bladder   . Nocturia   . Social anxiety disorder     There are no active problems to display for this patient.   Past Surgical History:  Procedure Laterality Date  . CYSTOSCOPY WITH INSERTION OF UROLIFT N/A 01/08/2017   Procedure: CYSTOSCOPY WITH INSERTION OF UROLIFT;  Surgeon: Cleon Gustin, MD;  Location: [facility];  Service: Urology;  Laterality: N/A;  . INGUINAL HERNIA REPAIR Bilateral 2009        Home Medications    Prior to Admission medications   Medication Sig Start Date End Date Taking? Authorizing Provider  ALPRAZolam Duanne Moron) 1 MG tablet Take 1 mg by mouth 2 (two) times daily.  04/24/16   [provider]  cephALEXin (KEFLEX) 500 MG capsule Take 1 capsule (500 mg total) by mouth 4 (four) times daily. 02/04/19   Deno Etienne, DO  divalproex (DEPAKOTE ER) 500 MG 24 hr tablet Take 1,000 mg by mouth 2 (two) times daily.  04/24/16 01/04/26  [provider]    Family History History reviewed. No pertinent family history.  Social History Social History   Tobacco Use  . Smoking status: Current Every Day Smoker    Packs/day: 1.00    Years: 25.00    Pack years: 25.00    Types: Cigarettes  . Smokeless tobacco: Never Used  Substance Use Topics  . Alcohol use: Yes    Comment: rare  . Drug use: Not on file     Allergies   Patient has no known allergies.   Review of Systems Review of Systems  Constitutional: Negative for chills and fever.  HENT: Negative for congestion and facial swelling.   Eyes: Negative for discharge and visual disturbance.  Respiratory: Negative for shortness of breath.   Cardiovascular: Negative for chest pain and palpitations.  Gastrointestinal: Negative for abdominal pain, diarrhea and vomiting.  Genitourinary: Positive for discharge, dysuria and flank pain.  Musculoskeletal: Negative for arthralgias and myalgias.  Skin: Negative for color change and rash.  Neurological: Negative for tremors, syncope and headaches.  Psychiatric/Behavioral: Negative for confusion and dysphoric mood.     Physical Exam Updated Vital Signs BP 115/77 (BP Location: Right Arm)   Pulse 86   Temp 98.4 F (36.9 C) (Oral)  Resp 14   SpO2 99%   Physical Exam Vitals signs and nursing note reviewed.  Constitutional:      Appearance: He is well-developed.  HENT:     Head: Normocephalic and atraumatic.  Eyes:     Pupils: Pupils are equal, round, and reactive to light.  Neck:     Musculoskeletal: Normal range of motion and neck supple.     Vascular: No JVD.  Cardiovascular:     Rate and Rhythm: Normal rate and regular rhythm.     Heart sounds: No murmur. No friction rub. No gallop.   Pulmonary:     Effort: No respiratory distress.     Breath sounds: No wheezing.  Abdominal:      General: There is no distension.     Tenderness: There is no right CVA tenderness, left CVA tenderness, guarding or rebound.  Musculoskeletal: Normal range of motion.  Skin:    Coloration: Skin is not pale.     Findings: No rash.  Neurological:     Mental Status: He is alert and oriented to person, place, and time.  Psychiatric:        Behavior: Behavior normal.      ED Treatments / Results  Labs (all labs ordered are listed, but only abnormal results are displayed) Labs Reviewed  URINALYSIS, ROUTINE W REFLEX MICROSCOPIC - Abnormal; Notable for the following components:      Result Value   APPearance CLOUDY (*)    Specific Gravity, Urine 1.003 (*)    Hgb urine dipstick MODERATE (*)    Protein, ur 30 (*)    Nitrite POSITIVE (*)    Leukocytes,Ua LARGE (*)    WBC, UA >50 (*)    Bacteria, UA MANY (*)    All other components within normal limits  URINE CULTURE    EKG None  Radiology No results found.  Procedures Procedures (including critical care time)  Medications Ordered in ED Medications  acetaminophen (TYLENOL) tablet 1,000 mg (has no administration in time range)  ketorolac (TORADOL) injection 15 mg (has no administration in time range)  oxyCODONE (Oxy IR/ROXICODONE) immediate release tablet 5 mg (has no administration in time range)  cephALEXin (KEFLEX) capsule 1,000 mg (has no administration in time range)     Initial Impression / Assessment and Plan / ED Course  I have reviewed the triage vital signs and the nursing notes.  Pertinent labs & imaging results that were available during my care of the patient were reviewed by me and considered in my medical decision making (see chart for details).        53 yo M with a chief complaints of dysuria penile discharge and flank pain.  Going on for the past couple days.  UA is consistent with a urinary tract infection.  He is well-appearing and nontoxic.  Pain is not colicky that I would expect from a kidney  stone.  His urine cultures were reviewed for the past most recent one that had cultures and sensitivities was pansensitive.  Will treat with Keflex.  Urology follow-up.     8:58 PM:  I have discussed the diagnosis/risks/treatment options with the patient and believe the pt to be eligible for discharge home to follow-up with PCP, urology. We also discussed returning to the ED immediately if new or worsening sx occur. We discussed the sx which are most concerning (e.g., sudden worsening pain, fever, inability to tolerate by mouth) that necessitate immediate return. Medications administered to the patient during their visit  and any new prescriptions provided to the patient are listed below.  Medications given during this visit Medications  acetaminophen (TYLENOL) tablet 1,000 mg (has no administration in time range)  ketorolac (TORADOL) injection 15 mg (has no administration in time range)  oxyCODONE (Oxy IR/ROXICODONE) immediate release tablet 5 mg (has no administration in time range)  cephALEXin (KEFLEX) capsule 1,000 mg (has no administration in time range)     The patient appears reasonably screen and/or stabilized for discharge and I doubt any other medical condition or other [facility] requiring further screening, evaluation, or treatment in the ED at this time prior to discharge.    Final Clinical Impressions(s) / ED Diagnoses   Final diagnoses:  Lower urinary tract infectious disease    ED Discharge Orders         Ordered    cephALEXin (KEFLEX) 500 MG capsule  4 times daily     02/04/19 2053           Melene Plan, DO 02/04/19 2058

## 2019-02-04 NOTE — ED Notes (Signed)
Discharge instructions including use of narcotics and pain management discussed with pt. Pt verbalized understanding with no questions at this time. Pt ambulatory to go home with friend.

## 2019-02-04 NOTE — Discharge Instructions (Signed)
Return for fever, worsening symptoms.   Take 4 over the counter ibuprofen tablets 3 times a day or 2 over-the-counter naproxen tablets twice a day for pain. Also take tylenol 1000mg (2 extra strength) four times a day.

## 2019-02-04 NOTE — ED Triage Notes (Signed)
Pt here with c/o uti type symptoms times a few days , some discharge and frequency noted

## 2019-02-06 LAB — URINE CULTURE: Culture: >=100000 — AB

## 2019-02-07 ENCOUNTER — Telehealth: Payer: Self-pay

## 2019-02-07 NOTE — Telephone Encounter (Signed)
Post ED Visit - Positive Culture Follow-up  Culture report reviewed by antimicrobial stewardship pharmacist: [facility] []  Elenor Quinones, Pharm.D. []  Heide Guile, Pharm.D., BCPS AQ-ID []  Parks Neptune, Pharm.D., BCPS []  Alycia Rossetti, Pharm.D., BCPS []  [place], Pharm.D., BCPS, AAHIVP []  Legrand Como, Pharm.D., BCPS, AAHIVP []  Salome Arnt, PharmD, BCPS []  Johnnette Gourd, PharmD, BCPS [x]  Hughes Better, PharmD, BCPS []  Leeroy Cha, PharmD []  Laqueta Linden, PharmD, BCPS []  Albertina Parr, PharmD  [facility] []  Leodis Sias, PharmD []  Lindell Spar, PharmD []  Royetta Asal, PharmD []  Graylin Shiver, Rph []  Rema Fendt) Glennon Mac, PharmD []  Arlyn Dunning, PharmD []  Netta Cedars, PharmD []  Dia Sitter, PharmD []  Leone Haven, PharmD []  Gretta Arab, PharmD []  Theodis Shove, PharmD []  Peggyann Juba, PharmD []  Reuel Boom, PharmD   Positive urine culture Treated with Cephalexin, organism sensitive to the same and no further patient follow-up is required at this time.  Genia Del 02/07/2019, 10:56 AM

## 2019-10-21 ENCOUNTER — Encounter: Payer: Self-pay | Admitting: Physician Assistant

## 2019-12-08 ENCOUNTER — Ambulatory Visit
Admission: EM | Admit: 2019-12-08 | Discharge: 2019-12-08 | Disposition: A | Discharge status: Home / Self Care | Payer: BC Managed Care – PPO | Attending: Family Medicine | Admitting: Family Medicine

## 2019-12-08 ENCOUNTER — Encounter: Payer: Self-pay | Admitting: Emergency Medicine

## 2019-12-08 DIAGNOSIS — R300 Dysuria: Secondary | ICD-10-CM

## 2019-12-08 DIAGNOSIS — R350 Frequency of micturition: Secondary | ICD-10-CM

## 2019-12-08 DIAGNOSIS — N5082 Scrotal pain: Secondary | ICD-10-CM

## 2019-12-08 LAB — POCT URINALYSIS DIP (MANUAL ENTRY)
Bilirubin, UA: NEGATIVE
Glucose, UA: NEGATIVE mg/dL
Ketones, POC UA: NEGATIVE mg/dL
Nitrite, UA: POSITIVE — AB
Protein Ur, POC: >=300 mg/dL — AB
Spec Grav, UA: 1.02 (ref 1.010–1.025)
Urobilinogen, UA: 0.2 E.U./dL
pH, UA: 7 (ref 5.0–8.0)

## 2019-12-08 MED ORDER — METHOCARBAMOL 500 MG PO TABS: 500.0000 mg | ORAL_TABLET | Freq: Two times a day (BID) | ORAL | 0 refills | Status: DC

## 2019-12-08 MED ORDER — DOXYCYCLINE HYCLATE 100 MG PO CAPS: 100.0000 mg | ORAL_CAPSULE | Freq: Two times a day (BID) | ORAL | 0 refills | Status: DC

## 2019-12-08 MED ORDER — PHENAZOPYRIDINE HCL 200 MG PO TABS: 200.0000 mg | ORAL_TABLET | Freq: Three times a day (TID) | ORAL | 0 refills | Status: DC

## 2019-12-08 NOTE — ED Provider Notes (Signed)
[facility]   CC: UTI  SUBJECTIVE:  Terry Lloyd is a 54 y.o. male who complains of urinary frequency, urgency and dysuria for the past 3 days. Reports that he is experiencing scrotal pain and penile discharge as well. Discharge is not present at this time, but was there last night. Has a history of complicated UTI. Reports that he sees Dr Ronne Binning with [facility]. Pain is intermittent and describes it as sharp. Has tried OTC medications without relief. Symptoms are made worse with urination.  Admits to similar symptoms in the past.  Denies fever, chills, nausea, vomiting  LMP: No LMP for male patient.  ROS: As in HPI.  All other pertinent ROS negative.     Past Medical History:  Diagnosis Date  . Bipolar 2 disorder (HCC)   . BPH (benign prostatic hyperplasia)   . GAD (generalized anxiety disorder)   . Incomplete emptying of bladder   . Nocturia   . Social anxiety disorder    Past Surgical History:  Procedure Laterality Date  . CYSTOSCOPY WITH INSERTION OF UROLIFT N/A 01/08/2017   Procedure: CYSTOSCOPY WITH INSERTION OF UROLIFT;  Surgeon: Malen Gauze, MD;  Location: [facility];  Service: Urology;  Laterality: N/A;  . INGUINAL HERNIA REPAIR Bilateral 2009   No Known Allergies No current facility-administered medications on file prior to encounter.   Current Outpatient Medications on File Prior to Encounter  Medication Sig Dispense Refill  . ALPRAZolam (XANAX) 1 MG tablet Take 1 mg by mouth 2 (two) times daily.     . divalproex (DEPAKOTE ER) 500 MG 24 hr tablet Take 1,000 mg by mouth 2 (two) times daily.      Social History   Socioeconomic History  . Marital status: Divorced    Spouse name: Not on file  . Number of children: Not on file  . Years of education: Not on file  . Highest education level: Not on file  Occupational History  . Not on file  Tobacco Use  . Smoking status: Current Every Day Smoker    Packs/day: 1.00     Years: 25.00    Pack years: 25.00    Types: Cigarettes  . Smokeless tobacco: Never Used  Vaping Use  . Vaping Use: Never used  Substance and Sexual Activity  . Alcohol use: Yes    Comment: rare  . Drug use: Not on file  . Sexual activity: Not on file  Other Topics Concern  . Not on file  Social History Narrative  . Not on file   Social Determinants of Health   Financial Resource Strain:   . Difficulty of Paying Living Expenses:   Food Insecurity:   . Worried About Programme researcher, broadcasting/film/video in the Last Year:   . Barista in the Last Year:   Transportation Needs:   . Freight forwarder (Medical):   Marland Kitchen Lack of Transportation (Non-Medical):   Physical Activity:   . Days of Exercise per Week:   . Minutes of Exercise per Session:   Stress:   . Feeling of Stress :   Social Connections:   . Frequency of Communication with Friends and Family:   . Frequency of Social Gatherings with Friends and Family:   . Attends Religious Services:   . Active Member of Clubs or Organizations:   . Attends Banker Meetings:   Marland Kitchen Marital Status:   Intimate Partner Violence:   . Fear of Current or Ex-Partner:   .  Emotionally Abused:   Marland Kitchen Physically Abused:   . Sexually Abused:    No family history on file.  OBJECTIVE:  Vitals:   12/08/19 1312 12/08/19 1322  BP: (!) 126/86   Pulse: 82   Resp: 17   Temp: 98.3 F (36.8 C)   TempSrc: Oral   SpO2: 97%   Weight:  163 lb 2.3 oz (74 kg)  Height:  5\' 7"  (1.702 m)   General appearance: AOx3 in no acute distress HEENT: NCAT. Oropharynx clear.  Lungs: clear to auscultation bilaterally without adventitious breath sounds Heart: regular rate and rhythm. Radial pulses 2+ symmetrical bilaterally Abdomen: soft; non-distended; no tenderness; bowel sounds present; no guarding or rebound tenderness Back: no CVA tenderness Extremities: no edema; symmetrical with no gross deformities Skin: warm and dry Neurologic: Ambulates from chair  to exam table without difficulty Psychological: alert and cooperative; normal mood and affect  Labs Reviewed  POCT URINALYSIS DIP (MANUAL ENTRY) - Abnormal; Notable for the following components:      Result Value   Clarity, UA cloudy (*)    Blood, UA small (*)    Protein Ur, POC >=300 (*)    Nitrite, UA Positive (*)    Leukocytes, UA Large (3+) (*)    All other components within normal limits  URINE CULTURE    ASSESSMENT & PLAN:  1. Dysuria   2. Urinary frequency   3. Scrotal pain     Meds ordered this encounter  Medications  . doxycycline (VIBRAMYCIN) 100 MG capsule    Sig: Take 1 capsule (100 mg total) by mouth 2 (two) times daily.    Dispense:  14 capsule    Refill:  0    Order Specific Question:   Supervising Provider    Answer:   Merrilee Jansky  . methocarbamol (ROBAXIN) 500 MG tablet    Sig: Take 1 tablet (500 mg total) by mouth 2 (two) times daily.    Dispense:  20 tablet    Refill:  0    Order Specific Question:   Supervising Provider    Answer:   [id number] Merrilee Jansky  . phenazopyridine (PYRIDIUM) 200 MG tablet    Sig: Take 1 tablet (200 mg total) by mouth 3 (three) times daily.    Dispense:  12 tablet    Refill:  0    Order Specific Question:   Supervising Provider    Answer:   [id number] Merrilee Jansky   Prescribed doxycyline Prescribed Robaxin Prescribed Pyridium Urine culture sent We will call you with abnormal results that need further treatment.   Push fluids and get plenty of rest   Take antibiotic as directed and to completion Take pyridium as prescribed and as needed for symptomatic relief Follow up with PCP if symptoms persists Return here or go to ER if you have any new or worsening symptoms such as fever, worsening abdominal pain, nausea/vomiting, flank pain  Outlined signs and symptoms indicating need for more acute intervention. Patient verbalized understanding. After Visit Summary given.     [id number],  NP 12/08/19 1722

## 2019-12-08 NOTE — Discharge Instructions (Addendum)
I have sent in doxycycline for you to take twice a day for 7 days  Follow up with Dr Ronne Binning to be sure that infection clears  Follow up with the ER for high fever, unrelenting pain, other concerning symptoms

## 2019-12-08 NOTE — ED Triage Notes (Signed)
Discharge with urination and pelvis pain since this morning

## 2019-12-11 ENCOUNTER — Telehealth (HOSPITAL_COMMUNITY): Payer: Self-pay

## 2019-12-11 DIAGNOSIS — N390 Urinary tract infection, site not specified: Secondary | ICD-10-CM

## 2019-12-11 LAB — URINE CULTURE: Culture: >=100000 — AB

## 2019-12-11 MED ORDER — NITROFURANTOIN MONOHYD MACRO 100 MG PO CAPS: 100.0000 mg | ORAL_CAPSULE | Freq: Two times a day (BID) | ORAL | 0 refills | Status: DC

## 2019-12-11 NOTE — Telephone Encounter (Signed)
Pt prescribed Doxy at visit.  Susceptibility not confirmed.  Contacted pt who is agreeable to changing abx to Macrobid due to confirmed sensitivity.  RTC precautions given for ongoing/worsening s/s.  Pt verbalized understanding and had all questions answered.

## 2020-03-01 ENCOUNTER — Other Ambulatory Visit: Payer: Self-pay

## 2020-03-01 ENCOUNTER — Emergency Department (HOSPITAL_COMMUNITY)
Admission: EM | Admit: 2020-03-01 | Discharge: 2020-03-01 | Disposition: A | Discharge status: Home / Self Care | Payer: BC Managed Care – PPO | Attending: Emergency Medicine | Admitting: Emergency Medicine

## 2020-03-01 ENCOUNTER — Encounter (HOSPITAL_COMMUNITY): Payer: Self-pay

## 2020-03-01 DIAGNOSIS — M545 Low back pain, unspecified: Secondary | ICD-10-CM | POA: Diagnosis present

## 2020-03-01 DIAGNOSIS — N3001 Acute cystitis with hematuria: Secondary | ICD-10-CM | POA: Insufficient documentation

## 2020-03-01 DIAGNOSIS — F17210 Nicotine dependence, cigarettes, uncomplicated: Secondary | ICD-10-CM | POA: Insufficient documentation

## 2020-03-01 LAB — URINALYSIS, ROUTINE W REFLEX MICROSCOPIC
Bilirubin Urine: NEGATIVE
Glucose, UA: NEGATIVE mg/dL
Ketones, ur: NEGATIVE mg/dL
Nitrite: NEGATIVE
Protein, ur: 30 mg/dL — AB
Specific Gravity, Urine: 1.002 — ABNORMAL LOW (ref 1.005–1.030)
pH: 7 (ref 5.0–8.0)

## 2020-03-01 MED ORDER — HYDROCODONE-ACETAMINOPHEN 5-325 MG PO TABS
1.0000 | ORAL_TABLET | Freq: Four times a day (QID) | ORAL | 0 refills | Status: DC | PRN
Start: 2020-03-01 — End: 2020-11-20

## 2020-03-01 MED ORDER — HYDROCODONE-ACETAMINOPHEN 5-325 MG PO TABS
1.0000 | ORAL_TABLET | Freq: Once | ORAL | Status: AC
  Administered 2020-03-01: 1 via ORAL
  Filled 2020-03-01: qty 1

## 2020-03-01 MED ORDER — NITROFURANTOIN MONOHYD MACRO 100 MG PO CAPS
100.0000 mg | ORAL_CAPSULE | Freq: Once | ORAL | Status: AC
  Administered 2020-03-01: 100 mg via ORAL
  Filled 2020-03-01: qty 1

## 2020-03-01 MED ORDER — NITROFURANTOIN MONOHYD MACRO 100 MG PO CAPS
100.0000 mg | ORAL_CAPSULE | Freq: Two times a day (BID) | ORAL | 0 refills | Status: DC
Start: 2020-03-01 — End: 2020-11-20

## 2020-03-01 NOTE — ED Triage Notes (Signed)
Pt presents to ED with complaints of lower back pain radiating around to left lower abdomen. Pt denies urinary symptoms but states he has had some yellow discharge from his penis.

## 2020-03-01 NOTE — Discharge Instructions (Signed)
You definitely do have a recurrence of urinary infection.  Take the entire course of the antibiotic prescribed - this is the medicine you took in July based on that urine culture.  A new culture has been sent to ensure this antibiotic will still work.  Drink plenty of fluids to stay hydrated.  Call Dr Ronne Binning or your primary provider as needed for further assistance as needed.

## 2020-03-01 NOTE — ED Notes (Signed)
Entered room and introduced self to patient at this time. Pt appears in no acute distress, respirations are even and unlabored with equal chest rise and fall. Sitting in chair, call bell within reach. Pt educated on hourly rounding and call light use and is in agreement at this time.

## 2020-03-01 NOTE — ED Provider Notes (Signed)
[facility] EMERGENCY DEPARTMENT Provider Note   CSN: [id number] Arrival date & time: 03/01/20  1724     History Chief Complaint  Patient presents with  . Back Pain    Terry Lloyd is a 54 y.o. male with a past medical history as outlined below including frequent episodes of UTIs in association with BPH, under the care of Dr. Ronne Binning.  He reports that he has a UTI about 4 times per year on average.  He describes low back pain with urinary symptoms including urinary frequency and painful urination along with scrotal pain described as intermittent sharp stabs of pain.  He noted some yellow discharge at the end of his urine stream last night.  Still he he denies fevers or chills, has no nausea or vomiting.  He denies scrotal swelling, states his symptoms are similar to prior UTIs.  He does not have a history of kidney stones.  He also denies risk factors for STDs.  The history is provided by the patient.       Past Medical History:  Diagnosis Date  . Bipolar 2 disorder (HCC)   . BPH (benign prostatic hyperplasia)   . GAD (generalized anxiety disorder)   . Incomplete emptying of bladder   . Nocturia   . Social anxiety disorder     There are no problems to display for this patient.   Past Surgical History:  Procedure Laterality Date  . CYSTOSCOPY WITH INSERTION OF UROLIFT N/A 01/08/2017   Procedure: CYSTOSCOPY WITH INSERTION OF UROLIFT;  Surgeon: Malen Gauze, MD;  Location: [facility];  Service: Urology;  Laterality: N/A;  . INGUINAL HERNIA REPAIR Bilateral 2009       No family history on file.  Social History   Tobacco Use  . Smoking status: Current Every Day Smoker    Packs/day: 1.00    Years: 25.00    Pack years: 25.00    Types: Cigarettes  . Smokeless tobacco: Never Used  Vaping Use  . Vaping Use: Never used  Substance Use Topics  . Alcohol use: Yes    Comment: rare  . Drug use: Not on file    Home Medications Prior to Admission  medications   Medication Sig Start Date End Date Taking? Authorizing Provider  ALPRAZolam Prudy Feeler) 1 MG tablet Take 1 mg by mouth 2 (two) times daily.  04/24/16   [provider]  divalproex (DEPAKOTE ER) 500 MG 24 hr tablet Take 1,000 mg by mouth 2 (two) times daily.  04/24/16 01/04/26  [provider]  doxycycline (VIBRAMYCIN) 100 MG capsule Take 1 capsule (100 mg total) by mouth 2 (two) times daily. 12/08/19   Moshe Cipro, NP  HYDROcodone-acetaminophen (NORCO/VICODIN) 5-325 MG tablet Take 1 tablet by mouth every 6 (six) hours as needed. 03/01/20   Burgess Amor, PA-C  methocarbamol (ROBAXIN) 500 MG tablet Take 1 tablet (500 mg total) by mouth 2 (two) times daily. 12/08/19   Moshe Cipro, NP  nitrofurantoin, macrocrystal-monohydrate, (MACROBID) 100 MG capsule Take 1 capsule (100 mg total) by mouth 2 (two) times daily. 03/01/20   Burgess Amor, PA-C  phenazopyridine (PYRIDIUM) 200 MG tablet Take 1 tablet (200 mg total) by mouth 3 (three) times daily. 12/08/19   Moshe Cipro, NP    Allergies    Patient has no known allergies.  Review of Systems   Review of Systems  Constitutional: Negative for fever.  HENT: Negative for congestion and sore throat.   Eyes: Negative.   Respiratory: Negative for  chest tightness and shortness of breath.   Cardiovascular: Negative for chest pain.  Gastrointestinal: Negative for abdominal pain and nausea.  Genitourinary: Positive for discharge, dysuria and urgency. Negative for flank pain.  Musculoskeletal: Positive for back pain. Negative for arthralgias, joint swelling and neck pain.  Skin: Negative.  Negative for rash and wound.  Neurological: Negative for dizziness, weakness, light-headedness, numbness and headaches.  Psychiatric/Behavioral: Negative.     Physical Exam Updated Vital Signs BP 118/79 (BP Location: Right Arm)   Pulse 81   Temp 98.1 F (36.7 C) (Oral)   Resp 16   Ht 5\' 7"  (1.702 m)   Wt 81.6 kg   SpO2  98%   BMI 28.19 kg/m   Physical Exam Vitals and nursing note reviewed.  Constitutional:      Appearance: He is well-developed.  HENT:     Head: Normocephalic and atraumatic.  Eyes:     Conjunctiva/sclera: Conjunctivae normal.  Cardiovascular:     Rate and Rhythm: Normal rate and regular rhythm.     Heart sounds: Normal heart sounds.  Pulmonary:     Effort: Pulmonary effort is normal.     Breath sounds: Normal breath sounds. No wheezing.  Abdominal:     General: Bowel sounds are normal.     Palpations: Abdomen is soft.     Tenderness: There is no abdominal tenderness. There is no right CVA tenderness or left CVA tenderness.  Genitourinary:    Comments: deferred Musculoskeletal:        General: Normal range of motion.     Cervical back: Normal range of motion.  Skin:    General: Skin is warm and dry.  Neurological:     Mental Status: He is alert.     ED Results / Procedures / Treatments   Labs (all labs ordered are listed, but only abnormal results are displayed) Results for orders placed or performed during the hospital encounter of 03/01/20  Urinalysis, Routine w reflex microscopic Urine, Clean Catch  Result Value Ref Range   Color, Urine STRAW (A) YELLOW   APPearance CLEAR CLEAR   Specific Gravity, Urine 1.002 (L) 1.005 - 1.030   pH 7.0 5.0 - 8.0   Glucose, UA NEGATIVE NEGATIVE mg/dL   Hgb urine dipstick SMALL (A) NEGATIVE   Bilirubin Urine NEGATIVE NEGATIVE   Ketones, ur NEGATIVE NEGATIVE mg/dL   Protein, ur 30 (A) NEGATIVE mg/dL   Nitrite NEGATIVE NEGATIVE   Leukocytes,Ua MODERATE (A) NEGATIVE   RBC / HPF 0-5 0 - 5 RBC/hpf   WBC, UA 21-50 0 - 5 WBC/hpf   Bacteria, UA RARE (A) NONE SEEN   No results found.   EKG None  Radiology No results found.  Procedures Procedures (including critical care time)  Medications Ordered in ED Medications  HYDROcodone-acetaminophen (NORCO/VICODIN) 5-325 MG per tablet 1 tablet (1 tablet Oral Given 03/01/20 1955)    nitrofurantoin (macrocrystal-monohydrate) (MACROBID) capsule 100 mg (100 mg Oral Given 03/01/20 2129)    ED Course  I have reviewed the triage vital signs and the nursing notes.  Pertinent labs & imaging results that were available during my care of the patient were reviewed by me and considered in my medical decision making (see chart for details).    MDM Rules/Calculators/A&P                          Pt with uti sx, increased wbc's and hgb.  Per last urine cx, macrobid was sensitive,  first dose given here.  GC/chlamydia collected also, but low risk.  Plan follow-up with PCP or Dr. Ronne Binning as needed for worsening or persistent symptoms. Final Clinical Impression(s) / ED Diagnoses Final diagnoses:  Acute cystitis with hematuria    Rx / DC Orders ED Discharge Orders         Ordered    nitrofurantoin, macrocrystal-monohydrate, (MACROBID) 100 MG capsule  2 times daily        03/01/20 2100    HYDROcodone-acetaminophen (NORCO/VICODIN) 5-325 MG tablet  Every 6 hours PRN        03/01/20 2101           Burgess Amor, PA-C 03/01/20 2222    Pricilla Loveless, MD 03/02/20 [phone]

## 2020-03-02 MED FILL — Hydrocodone-Acetaminophen Tab 5-325 MG: ORAL | Qty: 6 | Status: AC

## 2020-03-03 LAB — GC/CHLAMYDIA PROBE AMP (~~LOC~~) NOT AT ARMC
Chlamydia: NEGATIVE
Comment: NEGATIVE
Comment: NORMAL
Neisseria Gonorrhea: NEGATIVE

## 2020-03-03 LAB — URINE CULTURE

## 2020-11-18 ENCOUNTER — Other Ambulatory Visit: Payer: Self-pay

## 2020-11-18 ENCOUNTER — Ambulatory Visit
Admission: RE | Admit: 2020-11-18 | Discharge: 2020-11-18 | Disposition: A | Discharge status: Home / Self Care | Payer: BC Managed Care – PPO | Source: Ambulatory Visit | Attending: Emergency Medicine | Admitting: Emergency Medicine

## 2020-11-18 VITALS — BP 111/76 | HR 96 | Temp 97.6°F | Resp 18

## 2020-11-18 DIAGNOSIS — R059 Cough, unspecified: Secondary | ICD-10-CM

## 2020-11-18 DIAGNOSIS — J209 Acute bronchitis, unspecified: Secondary | ICD-10-CM

## 2020-11-18 MED ORDER — PREDNISONE 10 MG (21) PO TBPK: ORAL_TABLET | Freq: Every day | ORAL | 0 refills | Status: AC

## 2020-11-18 MED ORDER — DEXAMETHASONE SODIUM PHOSPHATE 10 MG/ML IJ SOLN
10.0000 mg | Freq: Once | INTRAMUSCULAR | Status: AC
  Administered 2020-11-18: 10 mg via INTRAMUSCULAR

## 2020-11-18 MED ORDER — BENZONATATE 100 MG PO CAPS: 100.0000 mg | ORAL_CAPSULE | Freq: Three times a day (TID) | ORAL | 0 refills | Status: DC

## 2020-11-18 NOTE — ED Provider Notes (Signed)
[facility]   [id number] 11/18/20 Arrival Time: 1244   CC: COVID symptoms  SUBJECTIVE: History from: patient.  Terry Lloyd is a 55 y.o. male who presents with cough and congestion x 3 days ago  Denies sick exposure to COVID, flu or strep. Admits to recent travel to [place].  Has tried OTC medications without relief.  Symptoms are made worse with deep breath.  Reports previous symptoms in the past with bronchitis.   Denies fever, chills, sore throat, SOB, chest pain, nausea, changes in bowel or bladder habits.    ROS: As per HPI.  All other pertinent ROS negative.     Past Medical History:  Diagnosis Date   Bipolar 2 disorder (HCC)    BPH (benign prostatic hyperplasia)    GAD (generalized anxiety disorder)    Incomplete emptying of bladder    Nocturia    Social anxiety disorder    Past Surgical History:  Procedure Laterality Date   CYSTOSCOPY WITH INSERTION OF UROLIFT N/A 01/08/2017   Procedure: CYSTOSCOPY WITH INSERTION OF UROLIFT;  Surgeon: Malen Gauze, MD;  Location: [facility];  Service: Urology;  Laterality: N/A;   INGUINAL HERNIA REPAIR Bilateral 2009   No Known Allergies No current facility-administered medications on file prior to encounter.   Current Outpatient Medications on File Prior to Encounter  Medication Sig Dispense Refill   ALPRAZolam (XANAX) 1 MG tablet Take 1 mg by mouth 2 (two) times daily.      divalproex (DEPAKOTE ER) 500 MG 24 hr tablet Take 1,000 mg by mouth 2 (two) times daily.      doxycycline (VIBRAMYCIN) 100 MG capsule Take 1 capsule (100 mg total) by mouth 2 (two) times daily. 14 capsule 0   HYDROcodone-acetaminophen (NORCO/VICODIN) 5-325 MG tablet Take 1 tablet by mouth every 6 (six) hours as needed. 6 tablet 0   methocarbamol (ROBAXIN) 500 MG tablet Take 1 tablet (500 mg total) by mouth 2 (two) times daily. 20 tablet 0   nitrofurantoin, macrocrystal-monohydrate, (MACROBID) 100 MG capsule Take 1 capsule (100 mg  total) by mouth 2 (two) times daily. 20 capsule 0   phenazopyridine (PYRIDIUM) 200 MG tablet Take 1 tablet (200 mg total) by mouth 3 (three) times daily. 12 tablet 0   Social History   Socioeconomic History   Marital status: Divorced    Spouse name: Not on file   Number of children: Not on file   Years of education: Not on file   Highest education level: Not on file  Occupational History   Not on file  Tobacco Use   Smoking status: Every Day    Packs/day: 1.00    Years: 25.00    Pack years: 25.00    Types: Cigarettes   Smokeless tobacco: Never  Vaping Use   Vaping Use: Never used  Substance and Sexual Activity   Alcohol use: Yes    Comment: rare   Drug use: Not on file   Sexual activity: Not on file  Other Topics Concern   Not on file  Social History Narrative   Not on file   Social Determinants of Health   Financial Resource Strain: Not on file  Food Insecurity: Not on file  Transportation Needs: Not on file  Physical Activity: Not on file  Stress: Not on file  Social Connections: Not on file  Intimate Partner Violence: Not on file   No family history on file.  OBJECTIVE:  Vitals:   11/18/20 1338  BP: 111/76  Pulse: 96  Resp: 18  Temp: 97.6 F (36.4 C)  TempSrc: Oral  SpO2: 96%     General appearance: alert; appears fatigued, but nontoxic; speaking in full sentences and tolerating own secretions HEENT: NCAT; Ears: EACs clear, TMs pearly gray; Eyes: PERRL.  EOM grossly intact. Nose: nares patent with clear rhinorrhea, Throat: oropharynx clear, tonsils non erythematous or enlarged, uvula midline  Neck: supple without LAD Lungs: unlabored respirations, symmetrical air entry; cough: moderate; no respiratory distress; rhonchi and wheezes throughout Heart: regular rate and rhythm.   Skin: warm and dry Psychological: alert and cooperative; normal mood and affect  ASSESSMENT & PLAN:  1. Cough   2. Acute bronchitis, unspecified organism     Meds ordered  this encounter  Medications   benzonatate (TESSALON) 100 MG capsule    Sig: Take 1 capsule (100 mg total) by mouth every 8 (eight) hours.    Dispense:  21 capsule    Refill:  0    Order Specific Question:   Supervising Provider    Answer:   Eustace Moore [[id number]]   dexamethasone (DECADRON) injection 10 mg   predniSONE (STERAPRED UNI-PAK 21 TAB) 10 MG (21) TBPK tablet    Sig: Take by mouth daily. Take 6 tabs by mouth daily  for 2 days, then 5 tabs for 2 days, then 4 tabs for 2 days, then 3 tabs for 2 days, 2 tabs for 2 days, then 1 tab by mouth daily for 2 days    Dispense:  42 tablet    Refill:  0    Order Specific Question:   Supervising Provider    Answer:   Eustace Moore [[id number]]    COVID testing ordered.  It will take between 5-7 days for test results.  Someone will contact you regarding abnormal results.    In the meantime: You should remain isolated in your home for 5 days from symptom onset AND greater than 72 hours after symptoms resolution (absence of fever without the use of fever-reducing medication and improvement in respiratory symptoms), whichever is longer Get plenty of rest and push fluids Tessalon Perles prescribed for cough Use OTC zyrtec for nasal congestion, runny nose, and/or sore throat Use OTC flonase for nasal congestion and runny nose Use medications daily for symptom relief Use OTC medications like ibuprofen or tylenol as needed fever or pain Call or go to the ED if you have any new or worsening symptoms such as fever, worsening cough, shortness of breath, chest tightness, chest pain, turning blue, changes in mental status, etc...   Reviewed expectations re: course of current medical issues. Questions answered. Outlined signs and symptoms indicating need for more acute intervention. Patient verbalized understanding. After Visit Summary given.          Rennis Harding, PA-C 11/18/20 1401

## 2020-11-18 NOTE — ED Triage Notes (Signed)
Cough and congestion since Monday.  Had e visit yesterday and was sent in 2 different cough syrups.

## 2020-11-18 NOTE — Discharge Instructions (Addendum)

## 2020-11-19 ENCOUNTER — Emergency Department (HOSPITAL_COMMUNITY)
Admission: EM | Admit: 2020-11-19 | Discharge: 2020-11-20 | Disposition: A | Discharge status: Home / Self Care | Payer: BC Managed Care – PPO | Attending: Emergency Medicine | Admitting: Emergency Medicine

## 2020-11-19 ENCOUNTER — Emergency Department (HOSPITAL_COMMUNITY): Payer: BC Managed Care – PPO

## 2020-11-19 ENCOUNTER — Other Ambulatory Visit: Payer: Self-pay

## 2020-11-19 ENCOUNTER — Encounter (HOSPITAL_COMMUNITY): Payer: Self-pay | Admitting: Emergency Medicine

## 2020-11-19 DIAGNOSIS — R0789 Other chest pain: Secondary | ICD-10-CM | POA: Insufficient documentation

## 2020-11-19 DIAGNOSIS — F17210 Nicotine dependence, cigarettes, uncomplicated: Secondary | ICD-10-CM | POA: Diagnosis not present

## 2020-11-19 DIAGNOSIS — Z20822 Contact with and (suspected) exposure to covid-19: Secondary | ICD-10-CM | POA: Insufficient documentation

## 2020-11-19 DIAGNOSIS — R739 Hyperglycemia, unspecified: Secondary | ICD-10-CM | POA: Insufficient documentation

## 2020-11-19 DIAGNOSIS — E871 Hypo-osmolality and hyponatremia: Secondary | ICD-10-CM | POA: Insufficient documentation

## 2020-11-19 DIAGNOSIS — R059 Cough, unspecified: Secondary | ICD-10-CM

## 2020-11-19 DIAGNOSIS — R000 Tachycardia, unspecified: Secondary | ICD-10-CM | POA: Diagnosis not present

## 2020-11-19 DIAGNOSIS — J189 Pneumonia, unspecified organism: Secondary | ICD-10-CM

## 2020-11-19 LAB — BASIC METABOLIC PANEL
Anion gap: 9 (ref 5–15)
BUN: 8 mg/dL (ref 6–20)
CO2: 23 mmol/L (ref 22–32)
Calcium: 8.7 mg/dL — ABNORMAL LOW (ref 8.9–10.3)
Chloride: 95 mmol/L — ABNORMAL LOW (ref 98–111)
Creatinine, Ser: 0.54 mg/dL — ABNORMAL LOW (ref 0.61–1.24)
GFR, Estimated: >60 mL/min (ref >60)
Glucose, Bld: 108 mg/dL — ABNORMAL HIGH (ref 70–99)
Potassium: 4 mmol/L (ref 3.5–5.1)
Sodium: 127 mmol/L — ABNORMAL LOW (ref 135–145)

## 2020-11-19 LAB — CBC WITH DIFFERENTIAL/PLATELET
Abs Immature Granulocytes: 0.09 10*3/uL — ABNORMAL HIGH (ref 0.00–0.07)
Basophils Absolute: 0.1 10*3/uL (ref 0.0–0.1)
Basophils Relative: 0 %
Eosinophils Absolute: 0 10*3/uL (ref 0.0–0.5)
Eosinophils Relative: 0 %
HCT: 39.6 % (ref 39.0–52.0)
Hemoglobin: 14 g/dL (ref 13.0–17.0)
Immature Granulocytes: 1 %
Lymphocytes Relative: 9 %
Lymphs Abs: 1.4 10*3/uL (ref 0.7–4.0)
MCH: 32.3 pg (ref 26.0–34.0)
MCHC: 35.4 g/dL (ref 30.0–36.0)
MCV: 91.2 fL (ref 80.0–100.0)
Monocytes Absolute: 1.8 10*3/uL — ABNORMAL HIGH (ref 0.1–1.0)
Monocytes Relative: 12 %
Neutro Abs: 12.3 10*3/uL — ABNORMAL HIGH (ref 1.7–7.7)
Neutrophils Relative %: 78 %
Platelets: 440 10*3/uL — ABNORMAL HIGH (ref 150–400)
RBC: 4.34 MIL/uL (ref 4.22–5.81)
RDW: 12.8 % (ref 11.5–15.5)
WBC: 15.8 10*3/uL — ABNORMAL HIGH (ref 4.0–10.5)
nRBC: 0 % (ref 0.0–0.2)

## 2020-11-19 LAB — RESP PANEL BY RT-PCR (FLU A&B, COVID) ARPGX2
Influenza A by PCR: NEGATIVE
Influenza B by PCR: NEGATIVE
SARS Coronavirus 2 by RT PCR: NEGATIVE

## 2020-11-19 MED ORDER — ALBUTEROL SULFATE HFA 108 (90 BASE) MCG/ACT IN AERS
4.0000 | INHALATION_SPRAY | Freq: Once | RESPIRATORY_TRACT | Status: AC
  Administered 2020-11-19: 4 via RESPIRATORY_TRACT
  Filled 2020-11-19: qty 6.7

## 2020-11-19 NOTE — ED Triage Notes (Signed)
Pt c/o cough since Monday. Was seen UC and was told to come here if not better.

## 2020-11-19 NOTE — ED Notes (Signed)
Patient states his chest and head hurts constantly from all the coughing. Patient states chest pain 5, head pain 8. Patient coughing constantly after ambulating. Patient's 02 sat between 94 and 97 percent on room air.

## 2020-11-20 ENCOUNTER — Emergency Department (HOSPITAL_COMMUNITY): Payer: BC Managed Care – PPO

## 2020-11-20 LAB — COVID-19, FLU A+B NAA
Influenza A, NAA: NOT DETECTED
Influenza B, NAA: NOT DETECTED
SARS-CoV-2, NAA: NOT DETECTED

## 2020-11-20 MED ORDER — BENZONATATE 100 MG PO CAPS: 100.0000 mg | ORAL_CAPSULE | Freq: Three times a day (TID) | ORAL | 0 refills | Status: AC | PRN

## 2020-11-20 MED ORDER — IOHEXOL 350 MG/ML SOLN
100.0000 mL | Freq: Once | INTRAVENOUS | Status: AC | PRN
  Administered 2020-11-20: 100 mL via INTRAVENOUS

## 2020-11-20 MED ORDER — DOXYCYCLINE HYCLATE 100 MG PO TABS
100.0000 mg | ORAL_TABLET | Freq: Once | ORAL | Status: AC
  Administered 2020-11-20: 100 mg via ORAL
  Filled 2020-11-20: qty 1

## 2020-11-20 MED ORDER — DOXYCYCLINE HYCLATE 100 MG PO CAPS: 100.0000 mg | ORAL_CAPSULE | Freq: Two times a day (BID) | ORAL | 0 refills | Status: AC

## 2020-11-20 NOTE — ED Notes (Signed)
Pt returned from CT Scan 

## 2020-11-20 NOTE — ED Provider Notes (Signed)
[facility] EMERGENCY DEPARTMENT Provider Note   CSN: [id number] Arrival date & time: 11/19/20  1721     History Chief Complaint  Patient presents with   Cough    Jaxsun Ciampi is a 55 y.o. male.  HPI  Patient with no significant medical history presents with chief complaint of cough.  Patient states this started on Monday, states he has a productive cough, but denies headaches, fevers, chills, nasal ingestion, sore throat, denies chest pain, shortness of breath, abdominal pain, general body aches.  Patient states he is up-to-date on his COVID and his influenza vaccines, denies recent sick contacts.  He does endorse that he recently just got back from [place].  He has no history of PEs or DVTs, currently not on hormone therapy, denies leg pain and/or leg swelling.  Patient was seen at urgent care  was given steroids but continued to have symptoms.  He denies alleviating factors.  Past Medical History:  Diagnosis Date   Bipolar 2 disorder (HCC)    BPH (benign prostatic hyperplasia)    GAD (generalized anxiety disorder)    Incomplete emptying of bladder    Nocturia    Social anxiety disorder     There are no problems to display for this patient.   Past Surgical History:  Procedure Laterality Date   CYSTOSCOPY WITH INSERTION OF UROLIFT N/A 01/08/2017   Procedure: CYSTOSCOPY WITH INSERTION OF UROLIFT;  Surgeon: Malen Gauze, MD;  Location: [facility];  Service: Urology;  Laterality: N/A;   INGUINAL HERNIA REPAIR Bilateral 2009       No family history on file.  Social History   Tobacco Use   Smoking status: Every Day    Packs/day: 1.00    Years: 25.00    Pack years: 25.00    Types: Cigarettes   Smokeless tobacco: Never  Vaping Use   Vaping Use: Never used  Substance Use Topics   Alcohol use: Yes    Comment: rare    Home Medications Prior to Admission medications   Medication Sig Start Date End Date Taking? Authorizing Provider   acetaminophen (TYLENOL) 500 MG tablet Take 500 mg by mouth every 6 (six) hours as needed.   Yes [provider]  ALPRAZolam Prudy Feeler) 1 MG tablet Take 1 mg by mouth 2 (two) times daily.  04/24/16  Yes [provider]  azelastine (ASTELIN) 0.1 % nasal spray Place 1 spray into both nostrils 2 (two) times daily. Use in each nostril as directed   Yes [provider]  benzonatate (TESSALON) 100 MG capsule Take 1 capsule (100 mg total) by mouth every 8 (eight) hours. 11/18/20  Yes Wurst, [place], PA-C  brompheniramine-pseudoephedrine-DM 30-2-10 MG/5ML syrup Take 5 mLs by mouth 4 (four) times daily as needed (cough). 11/17/20  Yes [provider]  Cranberry 1000 MG CAPS Take 1 capsule by mouth daily.   Yes [provider]  divalproex (DEPAKOTE ER) 500 MG 24 hr tablet Take 1,000 mg by mouth 2 (two) times daily.  04/24/16 01/04/26 Yes [provider]  fluticasone (FLONASE) 50 MCG/ACT nasal spray Place 2 sprays into both nostrils daily.   Yes [provider]  predniSONE (STERAPRED UNI-PAK 21 TAB) 10 MG (21) TBPK tablet Take by mouth daily. Take 6 tabs by mouth daily  for 2 days, then 5 tabs for 2 days, then 4 tabs for 2 days, then 3 tabs for 2 days, 2 tabs for 2 days, then 1 tab by mouth daily for 2  days 11/18/20  Yes Wurst, [place], PA-C  doxycycline (VIBRAMYCIN) 100 MG capsule Take 1 capsule (100 mg total) by mouth 2 (two) times daily. Patient not taking: No sig reported 12/08/19   Moshe Cipro, NP  HYDROcodone-acetaminophen (NORCO/VICODIN) 5-325 MG tablet Take 1 tablet by mouth every 6 (six) hours as needed. Patient not taking: No sig reported 03/01/20   Burgess Amor, PA-C  methocarbamol (ROBAXIN) 500 MG tablet Take 1 tablet (500 mg total) by mouth 2 (two) times daily. Patient not taking: No sig reported 12/08/19   Moshe Cipro, NP  nitrofurantoin, macrocrystal-monohydrate, (MACROBID) 100 MG capsule Take 1 capsule (100 mg total) by mouth 2  (two) times daily. Patient not taking: No sig reported 03/01/20   Burgess Amor, PA-C  phenazopyridine (PYRIDIUM) 200 MG tablet Take 1 tablet (200 mg total) by mouth 3 (three) times daily. Patient not taking: No sig reported 12/08/19   Moshe Cipro, NP    Allergies    Patient has no known allergies.  Review of Systems   Review of Systems  Constitutional:  Negative for chills and fever.  HENT:  Negative for congestion and sore throat.   Respiratory:  Positive for cough. Negative for shortness of breath.   Cardiovascular:  Negative for chest pain.  Gastrointestinal:  Negative for abdominal pain, diarrhea, nausea and vomiting.  Genitourinary:  Negative for enuresis.  Musculoskeletal:  Negative for back pain.  Skin:  Negative for rash.  Neurological:  Negative for headaches.  Hematological:  Does not bruise/bleed easily.   Physical Exam Updated Vital Signs BP 136/88   Pulse (!) 104   Temp 98.2 F (36.8 C)   Resp (!) 22   Ht 5\' 7"  (1.702 m)   Wt 81.6 kg   SpO2 93%   BMI 28.19 kg/m   Physical Exam Vitals and nursing note reviewed.  Constitutional:      General: He is not in acute distress.    Appearance: He is not ill-appearing.  HENT:     Head: Normocephalic and atraumatic.     Nose: No congestion.  Eyes:     Conjunctiva/sclera: Conjunctivae normal.  Cardiovascular:     Rate and Rhythm: Normal rate and regular rhythm.     Pulses: Normal pulses.     Heart sounds: No murmur heard.   No friction rub. No gallop.  Pulmonary:     Effort: No respiratory distress.     Breath sounds: No wheezing, rhonchi or rales.     Comments: Tight sounding with bibasilar Rales Chest:     Chest wall: Tenderness present.  Abdominal:     Palpations: Abdomen is soft.     Tenderness: There is no abdominal tenderness. There is no right CVA tenderness or left CVA tenderness.  Musculoskeletal:     Right lower leg: No edema.     Left lower leg: No edema.  Skin:    General: Skin is warm  and dry.  Neurological:     Mental Status: He is alert.  Psychiatric:        Mood and Affect: Mood normal.    ED Results / Procedures / Treatments   Labs (all labs ordered are listed, but only abnormal results are displayed) Labs Reviewed  BASIC METABOLIC PANEL - Abnormal; Notable for the following components:      Result Value   Sodium 127 (*)    Chloride 95 (*)    Glucose, Bld 108 (*)    Creatinine, Ser 0.54 (*)    Calcium 8.7 (*)  All other components within normal limits  CBC WITH DIFFERENTIAL/PLATELET - Abnormal; Notable for the following components:   WBC 15.8 (*)    Platelets 440 (*)    Neutro Abs 12.3 (*)    Monocytes Absolute 1.8 (*)    Abs Immature Granulocytes 0.09 (*)    All other components within normal limits  RESP PANEL BY RT-PCR (FLU A&B, COVID) [id number]    EKG None  Radiology DG Chest 2 View  Result Date: 11/19/2020 CLINICAL DATA:  Cough EXAM: CHEST - 2 VIEW COMPARISON:  None. FINDINGS: The heart size and mediastinal contours are within normal limits. Mild, diffuse interstitial pulmonary opacity. Bandlike atelectasis of the left midlung. The visualized skeletal structures are unremarkable. IMPRESSION: Mild, diffuse interstitial pulmonary opacity, which may reflect edema and or atypical/viral infection. Electronically Signed   By: Lauralyn Primes M.D.   On: 11/19/2020 19:08    Procedures Procedures   Medications Ordered in ED Medications  albuterol (VENTOLIN HFA) 108 (90 Base) MCG/ACT inhaler 4 puff (4 puffs Inhalation Given 11/19/20 2008)    ED Course  I have reviewed the triage vital signs and the nursing notes.  Pertinent labs & imaging results that were available during my care of the patient were reviewed by me and considered in my medical decision making (see chart for details).    MDM Rules/Calculators/A&P                         Initial impression-patient presents with cough.  He is alert, does not appear in acute stress, vital signs noted for  tachycardia.  Will obtain basic lab work-up, chest x-ray, provide patient with albuterol and reassess.  Work-up-CBC shows leukocytosis of 15.8, BMP shows slight hyponatremia 127, hyperglycemia 108, respiratory panel negative, chest x-ray reveals mild diffuse interstitial pulmonary opacities which may reflect edema and/or atypical/viral infection.  Reassessment-patient was reassessed, states he feels unchanged, notably tachycardic at this time.  Due to unexplained tachycardia with recent flight concern for possible PE.  Will obtain CTA  chest for further evaluation.  Rule out- Low suspicion for systemic infection as patient is nontoxic-appearing.  Does have noted leukocytosis suspect this acute phase reactant with an increased from recent steroid use.  Low suspicion for pneumonia as lung sounds are clear bilaterally, x-ray did not reveal any acute findings.  low suspicion for strep throat as oropharynx was visualized, no erythema or exudates noted.  Low suspicion patient would need  hospitalized due to viral infection or Covid as vital signs reassuring, patient is not in respiratory distress.    Plan-due to shift change patient will be handed off to Dr. Clayborne Dana he was provided HPI, current work-up, likely disposition.  Follow-up on CT of chest if negative patient can be discharged home with cough medicine follow-up PCP as needed.  Final Clinical Impression(s) / ED Diagnoses Final diagnoses:  Cough    Rx / DC Orders ED Discharge Orders     None        Carroll Sage, PA-C 11/20/20 0024    Mesner, Barbara Cower, MD 11/20/20 [phone]

## 2020-11-20 NOTE — ED Notes (Signed)
Patient transported to CT 

## 2022-02-19 IMAGING — DX DG CHEST 2V
2 series · 2 of 2 positions shown · non-contrast
Comparison: None.

CLINICAL DATA: Cough

EXAM:
CHEST - 2 VIEW

[chest pa]
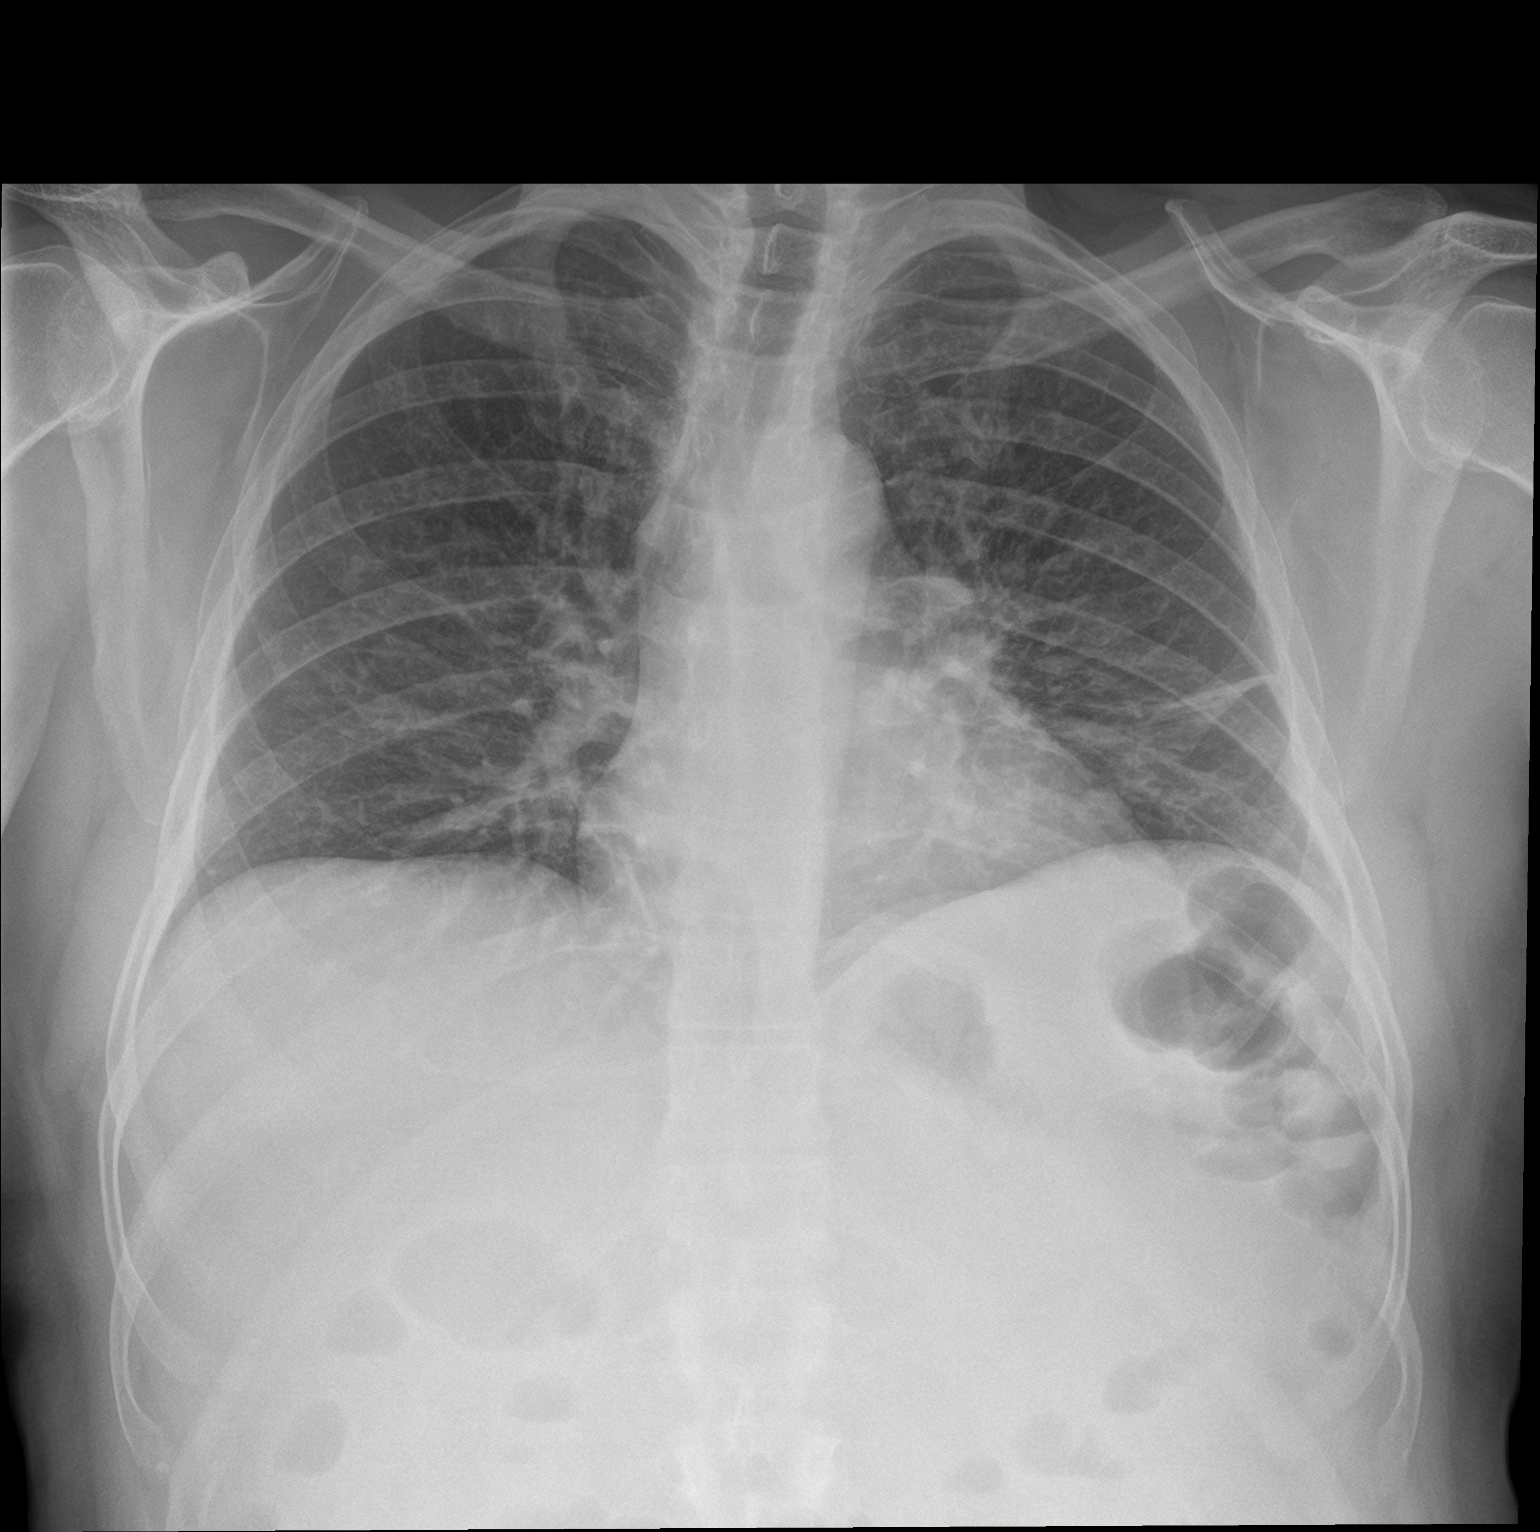

[chest lat]
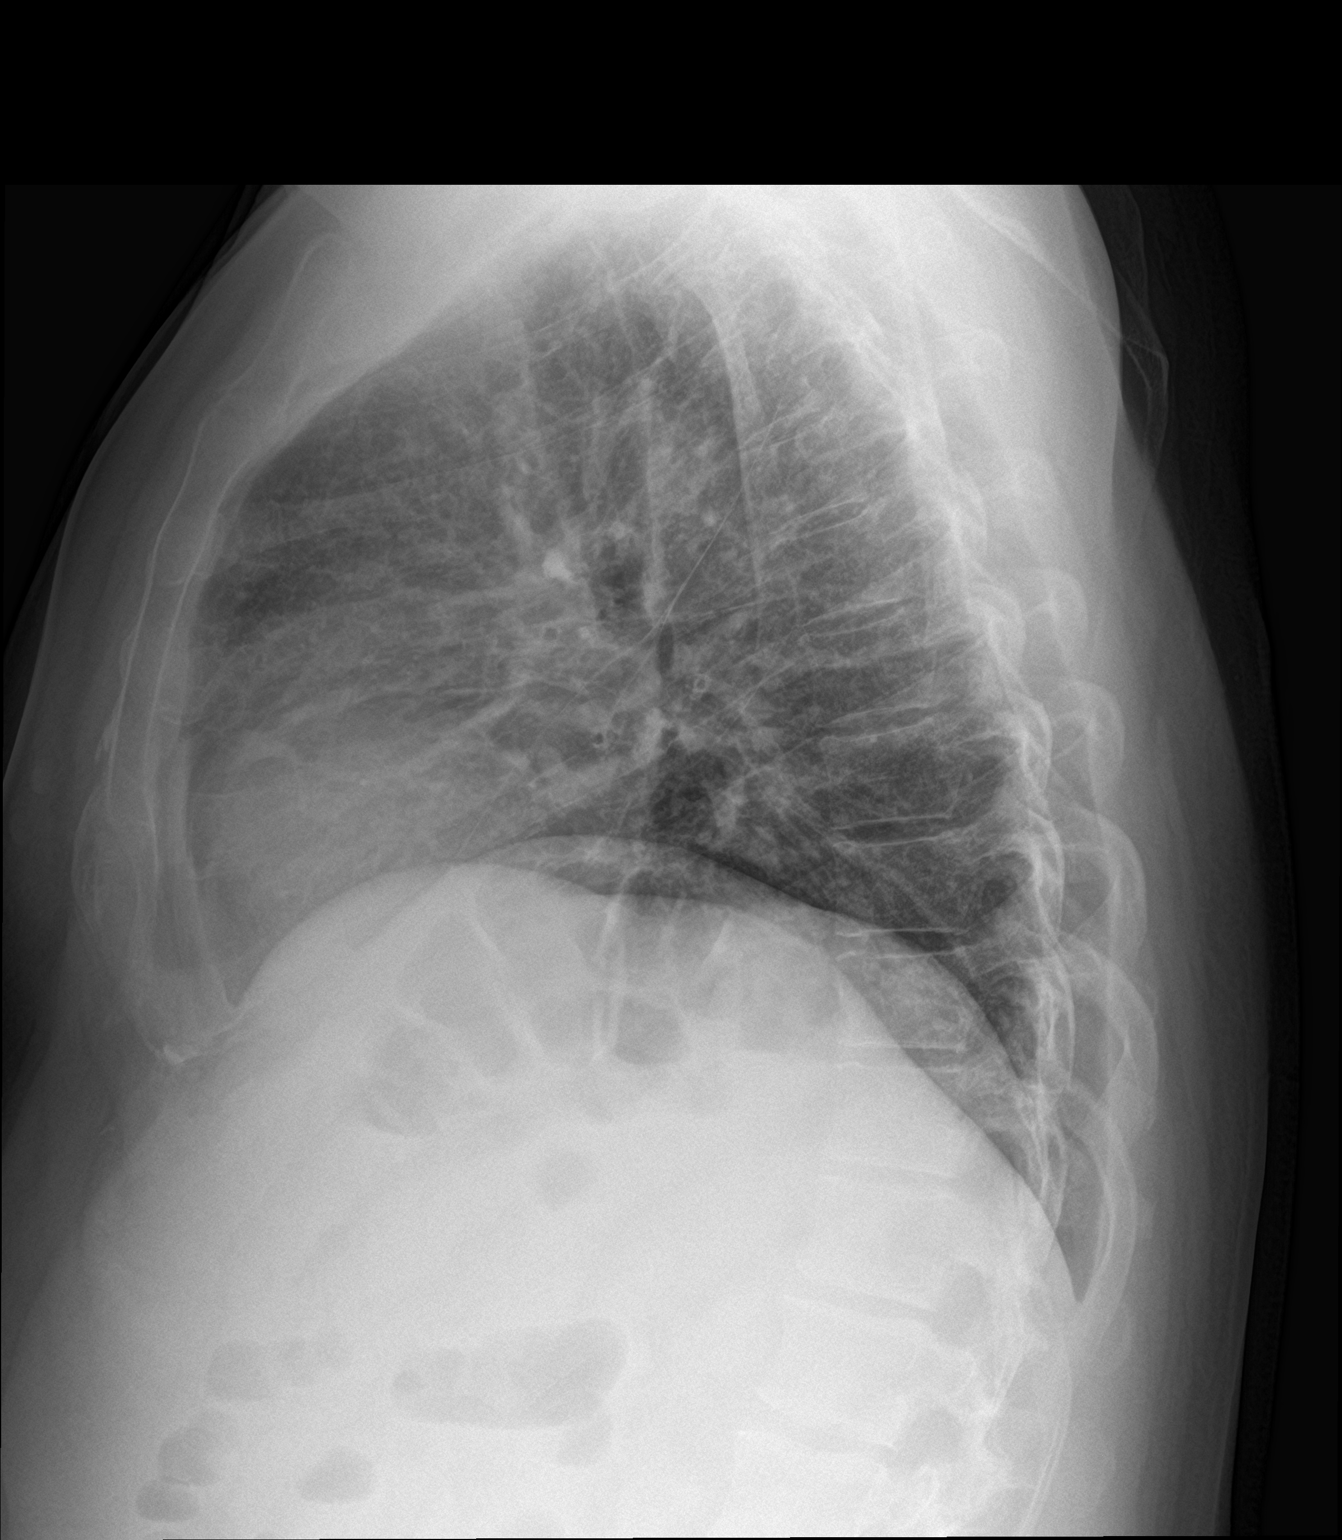

[2 of 2 positions shown; findings below may reference images not displayed]

FINDINGS: The heart size and mediastinal contours are within normal limits.
Mild, diffuse interstitial pulmonary opacity. Bandlike atelectasis
of the left midlung. The visualized skeletal structures are
unremarkable.
IMPRESSION: Mild, diffuse interstitial pulmonary opacity, which may reflect
edema and or atypical/viral infection.
